# Patient Record
Sex: Female | Born: 1975 | Race: White | Hispanic: No | Marital: Married | Smoking: Current every day smoker
Health system: Southern US, Community
[De-identification: ages and names within clinical notes are randomized; demographics above are authoritative.]

## PROBLEM LIST (undated history)

## (undated) DIAGNOSIS — E039 Hypothyroidism, unspecified: Secondary | ICD-10-CM

## (undated) DIAGNOSIS — I219 Acute myocardial infarction, unspecified: Secondary | ICD-10-CM

## (undated) DIAGNOSIS — Z8781 Personal history of (healed) traumatic fracture: Secondary | ICD-10-CM

## (undated) DIAGNOSIS — F419 Anxiety disorder, unspecified: Secondary | ICD-10-CM

## (undated) DIAGNOSIS — Z8742 Personal history of other diseases of the female genital tract: Secondary | ICD-10-CM

## (undated) DIAGNOSIS — M6530 Trigger finger, unspecified finger: Secondary | ICD-10-CM

## (undated) DIAGNOSIS — G709 Myoneural disorder, unspecified: Secondary | ICD-10-CM

## (undated) DIAGNOSIS — M1990 Unspecified osteoarthritis, unspecified site: Secondary | ICD-10-CM

## (undated) DIAGNOSIS — Z972 Presence of dental prosthetic device (complete) (partial): Secondary | ICD-10-CM

## (undated) DIAGNOSIS — K5792 Diverticulitis of intestine, part unspecified, without perforation or abscess without bleeding: Secondary | ICD-10-CM

## (undated) DIAGNOSIS — E785 Hyperlipidemia, unspecified: Secondary | ICD-10-CM

## (undated) DIAGNOSIS — E079 Disorder of thyroid, unspecified: Secondary | ICD-10-CM

## (undated) DIAGNOSIS — Z9889 Other specified postprocedural states: Secondary | ICD-10-CM

## (undated) HISTORY — PX: ABDOMINAL HYSTERECTOMY: SHX81

## (undated) HISTORY — PX: APPENDECTOMY: SHX54

## (undated) HISTORY — DX: Hyperlipidemia, unspecified: E78.5

## (undated) HISTORY — PX: LAPAROSCOPY: SHX197

## (undated) HISTORY — DX: Disorder of thyroid, unspecified: E07.9

## (undated) HISTORY — PX: TOTAL THYROIDECTOMY: SHX2547

## (undated) HISTORY — PX: MOUTH SURGERY: SHX715

## (undated) HISTORY — DX: Myoneural disorder, unspecified: G70.9

## (undated) HISTORY — DX: Other specified postprocedural states: Z98.890

## (undated) HISTORY — PX: ABDOMINAL ADHESION SURGERY: SHX90

## (undated) HISTORY — PX: TRIGGER FINGER RELEASE: SHX641

## (undated) HISTORY — PX: CHOLECYSTECTOMY: SHX55

## (undated) HISTORY — DX: Personal history of other diseases of the female genital tract: Z87.42

## (undated) HISTORY — PX: THYROIDECTOMY: SHX17

## (undated) HISTORY — PX: OTHER SURGICAL HISTORY: SHX169

---
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
## 1999-10-13 HISTORY — PX: ABDOMINAL HYSTERECTOMY: SHX81

## 2011-01-29 ENCOUNTER — Other Ambulatory Visit: Payer: Self-pay | Admitting: Orthopedic Surgery

## 2011-01-29 DIAGNOSIS — M25532 Pain in left wrist: Secondary | ICD-10-CM

## 2011-02-23 ENCOUNTER — Ambulatory Visit
Admission: RE | Admit: 2011-02-23 | Discharge: 2011-02-23 | Disposition: A | Discharge status: Home / Self Care | Payer: BC Managed Care – PPO | Source: Ambulatory Visit | Attending: Orthopedic Surgery | Admitting: Orthopedic Surgery

## 2011-02-23 DIAGNOSIS — M25532 Pain in left wrist: Secondary | ICD-10-CM

## 2011-03-25 ENCOUNTER — Ambulatory Visit (HOSPITAL_BASED_OUTPATIENT_CLINIC_OR_DEPARTMENT_OTHER)
Admission: RE | Admit: 2011-03-25 | Discharge: 2011-03-25 | Disposition: A | Discharge status: Home / Self Care | Payer: BC Managed Care – PPO | Source: Ambulatory Visit | Attending: Orthopedic Surgery | Admitting: Orthopedic Surgery

## 2011-03-25 DIAGNOSIS — M24139 Other articular cartilage disorders, unspecified wrist: Secondary | ICD-10-CM | POA: Insufficient documentation

## 2011-03-25 DIAGNOSIS — F17200 Nicotine dependence, unspecified, uncomplicated: Secondary | ICD-10-CM | POA: Insufficient documentation

## 2011-03-25 DIAGNOSIS — Z01812 Encounter for preprocedural laboratory examination: Secondary | ICD-10-CM | POA: Insufficient documentation

## 2011-03-25 DIAGNOSIS — X58XXXS Exposure to other specified factors, sequela: Secondary | ICD-10-CM | POA: Insufficient documentation

## 2011-03-25 DIAGNOSIS — F411 Generalized anxiety disorder: Secondary | ICD-10-CM | POA: Insufficient documentation

## 2011-03-25 DIAGNOSIS — K589 Irritable bowel syndrome without diarrhea: Secondary | ICD-10-CM | POA: Insufficient documentation

## 2011-03-25 HISTORY — PX: WRIST ARTHROSCOPY W/ TRIANGULAR FIBROCARTILAGE REPAIR: SHX2670

## 2011-03-25 LAB — POCT HEMOGLOBIN-HEMACUE: Hemoglobin: 15.8 g/dL — ABNORMAL HIGH (ref 12.0–15.0)

## 2011-05-01 NOTE — Op Note (Signed)
[name] [name], [name] [name]             ACCOUNT NO.:  [id number]  MEDICAL RECORD NO.:  [id number]  LOCATION:                                 FACILITY:  PHYSICIAN:  Cindee Salt, M.D.            DATE OF BIRTH:  DATE OF PROCEDURE:  03/25/2011 DATE OF DISCHARGE:                              OPERATIVE REPORT   PREOPERATIVE DIAGNOSIS:  Triangular fibrocartilage complex tear, LT tear, left wrist.  POSTOPERATIVE DIAGNOSIS:  Triangular fibrocartilage complex tear, LT tear, left wrist.  OPERATION:  Arthroscopic inspection, debridement LT tear repair, peripheral 1B TFCC tear, left wrist.  SURGEON:  Cindee Salt, MD  ANESTHESIA:  Axillary general.  ANESTHESIOLOGIST:  Janetta Hora. Frederick, MD  HISTORY:  The patient is a 35 year old female with a history of injury to her left wrist.  She has had ulnar-sided wrist pain, not responded to conservative treatment.  She has had an MRI done revealing extravasation of contrast within the ECU tendon sheath.  Pre, peri, and postoperative course have been discussed along with the risks and complications including the possibility of incomplete relief of symptoms, dystrophy, infection, recurrence, injury to arteries, nerves, tendons.  In the preoperative area, the patient was seen.  The extremity marked by both patient and surgeon.  Antibiotic given.  PROCEDURE IN DETAIL:  The patient was brought to the operating room where a general anesthetic was carried out without difficulty after a axillary block was carried out.  She was prepped using ChloraPrep, supine position, left arm free, 3-minute dry time was allowed.  Time-out taken, confirmed patient procedure.  The limb was placed in the arc arthroscopy tower, 10 pounds traction applied.  The joint inflated through 3/4 portal.  An attempt was made after making transverse incision deepening this with a hemostat with the entrance of a 7 mm scope, this was too large, did not enter the joint as such a  1.9-mm scope was then used.  The joint was inspected.  Scapholunate ligament was intact.  Volar radial wrist ligaments were intact.  There were no significant change to the articular surface.  The TFCC was noted to have a moderate tenosynovitis on its ulnar aspect.  A lunotriquetral tear was noted.  This allowed positioning of a 18 gauge needle for irrigation.  A 4/5 portal was then opened after localization with a 22 gauge needle.  A transverse incision made deepened with a hemostat.  Blunt trocar used to enter the joint.  The probe was then introduced.  The peripheral tear of the triangle fibrocartilage was noted.  A 2-mm full radius shaver was then introduced into the 4/5 portal, debridement of the synovial tissue over the tear was then performed.  An attempt was made to do a repair with a Tuohy needle, this was unsuccessful and that the joint was extremely tight.  It was decided to proceed with a Whipple type repair. A longitudinal incision was made over the dorsal aspect of the ECU tendon.  A 22-gauge needle inserted through the tear of the TFCC.  A second 22-gauge needle was introduced distal to the triangle fibrocartilage.  This was difficult to pass a 3-0 PDS suture as such  these were replaced with an 18-gauge needle on the distal needle and a 21-gauge needle on the proximal needle going through the triangle fibrocartilage.  This was able to be used to insert a PDS through the 21- gauge, a wire loop through the 18-gauge, and a completion of the tear repair was then performed after debridement of the synovial tissue. This firmly reattached the TFCC peripherally.  This was done through an incision longitudinal in nature down to the capsule to protect the dorsal sensory branch of the ulnar nerve and the ECU tendon.  The wound was then irrigated.  The debridement of the LT joint was then performed with a shaver.  The scope instruments were removed.  The portals closed along with the  incision with interrupted 4-0 Vicryl Rapide sutures.  A sterile compressive dressing long-arm splint with the wrist in neutral position was applied.  The tourniquet was not used.  On completion, the patient was taken to recovery room for observation in satisfactory condition.  She will be discharged home to return to the [facility] in 1 week on Vicodin.          ______________________________ Cindee Salt, M.D.     GK/MEDQ  D:  03/25/2011  T:  03/26/2011  Job:  [id number]  Electronically Signed by Cindee Salt M.D. on 05/01/2011 09:17:10 AM

## 2012-04-28 DIAGNOSIS — E890 Postprocedural hypothyroidism: Secondary | ICD-10-CM | POA: Insufficient documentation

## 2012-07-13 ENCOUNTER — Other Ambulatory Visit: Payer: Self-pay | Admitting: Orthopedic Surgery

## 2012-07-19 ENCOUNTER — Encounter (HOSPITAL_BASED_OUTPATIENT_CLINIC_OR_DEPARTMENT_OTHER): Payer: Self-pay | Admitting: *Deleted

## 2012-07-29 DIAGNOSIS — K5909 Other constipation: Secondary | ICD-10-CM | POA: Insufficient documentation

## 2012-08-24 ENCOUNTER — Ambulatory Visit (HOSPITAL_BASED_OUTPATIENT_CLINIC_OR_DEPARTMENT_OTHER): Admit: 2012-08-24 | Payer: BC Managed Care – PPO | Admitting: Orthopedic Surgery

## 2012-08-24 ENCOUNTER — Encounter (HOSPITAL_BASED_OUTPATIENT_CLINIC_OR_DEPARTMENT_OTHER): Payer: Self-pay

## 2012-08-24 SURGERY — RELEASE, A1 PULLEY, FOR TRIGGER FINGER
Anesthesia: Choice | Laterality: Right

## 2012-10-12 DIAGNOSIS — M6530 Trigger finger, unspecified finger: Secondary | ICD-10-CM

## 2012-10-12 HISTORY — DX: Trigger finger, unspecified finger: M65.30

## 2012-11-02 ENCOUNTER — Other Ambulatory Visit: Payer: Self-pay | Admitting: Orthopedic Surgery

## 2012-11-11 ENCOUNTER — Encounter (HOSPITAL_BASED_OUTPATIENT_CLINIC_OR_DEPARTMENT_OTHER): Payer: Self-pay | Admitting: *Deleted

## 2012-11-18 ENCOUNTER — Encounter (HOSPITAL_BASED_OUTPATIENT_CLINIC_OR_DEPARTMENT_OTHER): Payer: Self-pay

## 2012-11-18 ENCOUNTER — Ambulatory Visit (HOSPITAL_BASED_OUTPATIENT_CLINIC_OR_DEPARTMENT_OTHER): Payer: BC Managed Care – PPO | Admitting: Anesthesiology

## 2012-11-18 ENCOUNTER — Encounter (HOSPITAL_BASED_OUTPATIENT_CLINIC_OR_DEPARTMENT_OTHER): Payer: Self-pay | Admitting: Anesthesiology

## 2012-11-18 ENCOUNTER — Ambulatory Visit (HOSPITAL_BASED_OUTPATIENT_CLINIC_OR_DEPARTMENT_OTHER)
Admission: RE | Admit: 2012-11-18 | Discharge: 2012-11-18 | Disposition: A | Discharge status: Home / Self Care | Payer: BC Managed Care – PPO | Source: Ambulatory Visit | Attending: Orthopedic Surgery | Admitting: Orthopedic Surgery

## 2012-11-18 ENCOUNTER — Encounter (HOSPITAL_BASED_OUTPATIENT_CLINIC_OR_DEPARTMENT_OTHER)
Admission: RE | Disposition: A | Discharge status: Home / Self Care | Payer: Self-pay | Source: Ambulatory Visit | Attending: Orthopedic Surgery

## 2012-11-18 DIAGNOSIS — M6530 Trigger finger, unspecified finger: Secondary | ICD-10-CM | POA: Insufficient documentation

## 2012-11-18 DIAGNOSIS — M65839 Other synovitis and tenosynovitis, unspecified forearm: Secondary | ICD-10-CM | POA: Insufficient documentation

## 2012-11-18 HISTORY — DX: Hypothyroidism, unspecified: E03.9

## 2012-11-18 HISTORY — DX: Diverticulitis of intestine, part unspecified, without perforation or abscess without bleeding: K57.92

## 2012-11-18 HISTORY — DX: Presence of dental prosthetic device (complete) (partial): Z97.2

## 2012-11-18 HISTORY — DX: Unspecified osteoarthritis, unspecified site: M19.90

## 2012-11-18 HISTORY — PX: TRIGGER FINGER RELEASE: SHX641

## 2012-11-18 HISTORY — DX: Personal history of (healed) traumatic fracture: Z87.81

## 2012-11-18 HISTORY — DX: Anxiety disorder, unspecified: F41.9

## 2012-11-18 HISTORY — DX: Trigger finger, unspecified finger: M65.30

## 2012-11-18 SURGERY — RELEASE, A1 PULLEY, FOR TRIGGER FINGER
Anesthesia: Regional | Site: Finger | Laterality: Right | Wound class: Clean

## 2012-11-18 MED ORDER — CEFAZOLIN SODIUM-DEXTROSE 2-3 GM-% IV SOLR
2.0000 g | INTRAVENOUS | Status: AC
  Administered 2012-11-18: 2 g via INTRAVENOUS

## 2012-11-18 MED ORDER — LACTATED RINGERS IV SOLN
INTRAVENOUS | Status: DC
  Administered 2012-11-18: 12:00:00 via INTRAVENOUS

## 2012-11-18 MED ORDER — HYDROMORPHONE HCL PF 1 MG/ML IJ SOLN: 0.2500 mg | INTRAMUSCULAR | Status: DC | PRN

## 2012-11-18 MED ORDER — OXYCODONE HCL 5 MG/5ML PO SOLN: 5.0000 mg | Freq: Once | ORAL | Status: DC | PRN

## 2012-11-18 MED ORDER — ONDANSETRON HCL 4 MG/2ML IJ SOLN
INTRAMUSCULAR | Status: DC | PRN
  Administered 2012-11-18: 4 mg via INTRAVENOUS

## 2012-11-18 MED ORDER — BUPIVACAINE HCL (PF) 0.25 % IJ SOLN
INTRAMUSCULAR | Status: DC | PRN
  Administered 2012-11-18: 5 mL

## 2012-11-18 MED ORDER — CHLORHEXIDINE GLUCONATE 4 % EX LIQD: 60.0000 mL | Freq: Once | CUTANEOUS | Status: DC

## 2012-11-18 MED ORDER — OXYCODONE HCL 5 MG PO TABS: 5.0000 mg | ORAL_TABLET | Freq: Once | ORAL | Status: DC | PRN

## 2012-11-18 MED ORDER — MIDAZOLAM HCL 5 MG/5ML IJ SOLN
INTRAMUSCULAR | Status: DC | PRN
  Administered 2012-11-18: 2 mg via INTRAVENOUS

## 2012-11-18 MED ORDER — FENTANYL CITRATE 0.05 MG/ML IJ SOLN
INTRAMUSCULAR | Status: DC | PRN
  Administered 2012-11-18: 50 ug via INTRAVENOUS

## 2012-11-18 MED ORDER — MIDAZOLAM HCL 2 MG/2ML IJ SOLN: 1.0000 mg | INTRAMUSCULAR | Status: DC | PRN

## 2012-11-18 MED ORDER — HYDROCODONE-ACETAMINOPHEN 5-325 MG PO TABS: 1.0000 | ORAL_TABLET | Freq: Four times a day (QID) | ORAL | Status: DC | PRN

## 2012-11-18 MED ORDER — MIDAZOLAM HCL 2 MG/ML PO SYRP: 12.0000 mg | ORAL_SOLUTION | Freq: Once | ORAL | Status: DC | PRN

## 2012-11-18 MED ORDER — PROPOFOL 10 MG/ML IV EMUL
INTRAVENOUS | Status: DC | PRN
  Administered 2012-11-18: 75 ug/kg/min via INTRAVENOUS

## 2012-11-18 MED ORDER — FENTANYL CITRATE 0.05 MG/ML IJ SOLN: 50.0000 ug | INTRAMUSCULAR | Status: DC | PRN

## 2012-11-18 MED ORDER — PROMETHAZINE HCL 25 MG/ML IJ SOLN: 6.2500 mg | INTRAMUSCULAR | Status: DC | PRN

## 2012-11-18 MED ORDER — MEPERIDINE HCL 25 MG/ML IJ SOLN: 6.2500 mg | INTRAMUSCULAR | Status: DC | PRN

## 2012-11-18 MED ORDER — LIDOCAINE HCL (CARDIAC) 20 MG/ML IV SOLN
INTRAVENOUS | Status: DC | PRN
  Administered 2012-11-18: 30 mg via INTRAVENOUS

## 2012-11-18 SURGICAL SUPPLY — 34 items
BANDAGE COBAN STERILE 2 (GAUZE/BANDAGES/DRESSINGS) ×2 IMPLANT
BLADE SURG 15 STRL LF DISP TIS (BLADE) ×1 IMPLANT
BLADE SURG 15 STRL SS (BLADE) ×1
BNDG ESMARK 4X9 LF (GAUZE/BANDAGES/DRESSINGS) IMPLANT
CHLORAPREP W/TINT 26ML (MISCELLANEOUS) ×2 IMPLANT
CLOTH BEACON ORANGE TIMEOUT ST (SAFETY) ×2 IMPLANT
CORDS BIPOLAR (ELECTRODE) IMPLANT
COVER MAYO STAND STRL (DRAPES) ×2 IMPLANT
COVER TABLE BACK 60X90 (DRAPES) ×2 IMPLANT
CUFF TOURNIQUET SINGLE 18IN (TOURNIQUET CUFF) ×2 IMPLANT
DECANTER SPIKE VIAL GLASS SM (MISCELLANEOUS) IMPLANT
DRAPE EXTREMITY T 121X128X90 (DRAPE) ×2 IMPLANT
DRAPE SURG 17X23 STRL (DRAPES) ×2 IMPLANT
GAUZE XEROFORM 1X8 LF (GAUZE/BANDAGES/DRESSINGS) ×2 IMPLANT
GLOVE BIO SURGEON STRL SZ 6.5 (GLOVE) IMPLANT
GLOVE BIOGEL PI IND STRL 8.5 (GLOVE) ×1 IMPLANT
GLOVE BIOGEL PI INDICATOR 8.5 (GLOVE) ×1
GLOVE ECLIPSE 6.5 STRL STRAW (GLOVE) ×2 IMPLANT
GLOVE SURG ORTHO 8.0 STRL STRW (GLOVE) ×2 IMPLANT
GOWN BRE IMP PREV XXLGXLNG (GOWN DISPOSABLE) ×2 IMPLANT
GOWN PREVENTION PLUS XLARGE (GOWN DISPOSABLE) ×2 IMPLANT
NEEDLE 27GAX1X1/2 (NEEDLE) ×2 IMPLANT
NS IRRIG 1000ML POUR BTL (IV SOLUTION) ×2 IMPLANT
PACK BASIN DAY SURGERY FS (CUSTOM PROCEDURE TRAY) ×2 IMPLANT
PADDING CAST ABS 4INX4YD NS (CAST SUPPLIES)
PADDING CAST ABS COTTON 4X4 ST (CAST SUPPLIES) IMPLANT
SPONGE GAUZE 4X4 12PLY (GAUZE/BANDAGES/DRESSINGS) ×2 IMPLANT
STOCKINETTE 4X48 STRL (DRAPES) ×2 IMPLANT
SUT VICRYL RAPIDE 4/0 PS 2 (SUTURE) ×2 IMPLANT
SYR BULB 3OZ (MISCELLANEOUS) ×2 IMPLANT
SYR CONTROL 10ML LL (SYRINGE) ×2 IMPLANT
TOWEL OR 17X24 6PK STRL BLUE (TOWEL DISPOSABLE) ×4 IMPLANT
UNDERPAD 30X30 INCONTINENT (UNDERPADS AND DIAPERS) ×2 IMPLANT
WATER STERILE IRR 1000ML POUR (IV SOLUTION) IMPLANT

## 2012-11-18 NOTE — Brief Op Note (Signed)
11/18/2012  1:38 PM  PATIENT:  [name] [name]  37 y.o. female  PRE-OPERATIVE DIAGNOSIS:  sts right middle finger   POST-OPERATIVE DIAGNOSIS:  sts right middle finger   PROCEDURE:  Procedure(s) (LRB) with comments: RELEASE TRIGGER FINGER/A-1 PULLEY (Right)  SURGEON:  Surgeon(s) and Role:    * Nicki Reaper, MD - Primary  PHYSICIAN ASSISTANT:   ASSISTANTS: none   ANESTHESIA:   local and regional  EBL:  Total I/O In: 700 [I.V.:700] Out: -   BLOOD ADMINISTERED:none  DRAINS: none   LOCAL MEDICATIONS USED:  MARCAINE     SPECIMEN:  No Specimen  DISPOSITION OF SPECIMEN:  N/A  COUNTS:  YES  TOURNIQUET:   Total Tourniquet Time Documented: Forearm (Right) - 14 minutes  DICTATION: .Other Dictation: Dictation Number [id number]  PLAN OF CARE: Discharge to home after PACU  PATIENT DISPOSITION:  PACU - hemodynamically stable.

## 2012-11-18 NOTE — Anesthesia Procedure Notes (Signed)
Procedure Name: MAC Date/Time: 11/18/2012 1:17 PM Performed by: Brieanne Mignone D Pre-anesthesia Checklist: Patient identified, Emergency Drugs available, Suction available, Patient being monitored and Timeout performed Patient Re-evaluated:Patient Re-evaluated prior to inductionOxygen Delivery Method: Simple face mask

## 2012-11-18 NOTE — Transfer of Care (Signed)
Immediate Anesthesia Transfer of Care Note  Patient: [name] [name]  Procedure(s) Performed: Procedure(s) (LRB) with comments: RELEASE TRIGGER FINGER/A-1 PULLEY (Right)  Patient Location: PACU  Anesthesia Type:Bier block  Level of Consciousness: awake, alert , oriented and patient cooperative  Airway & Oxygen Therapy: Patient Spontanous Breathing and Patient connected to face mask oxygen  Post-op Assessment: Report given to PACU RN and Post -op Vital signs reviewed and stable  Post vital signs: Reviewed and stable  Complications: No apparent anesthesia complications

## 2012-11-18 NOTE — Op Note (Signed)
[name] [name], [name] [name]             ACCOUNT NO.:  [id number]  MEDICAL RECORD NO.:  [id number]  LOCATION:                                 FACILITY:  PHYSICIAN:  Cindee Salt, M.D.            DATE OF BIRTH:  DATE OF PROCEDURE:  11/18/2012 DATE OF DISCHARGE:                              OPERATIVE REPORT   PREOPERATIVE DIAGNOSIS:  Stenosing tenosynovitis, right middle finger.  POSTOPERATIVE DIAGNOSIS:  Stenosing tenosynovitis, right middle finger.  OPERATION:  Release A1 pulley, right middle finger.  SURGEON:  Cindee Salt, M.D.  ANESTHESIA:  Forearm-based IV regional with local infiltration.  ANESTHESIOLOGIST:  Sheldon Silvan, M.D.  HISTORY:  The patient is a 37 year old female with a history of triggering of her right middle finger.  This has not responded to conservative treatment.  She has elected to undergo surgical release. Pre, peri, and postoperative course have been discussed along with risks and complications.  She is aware that there is no guarantee with the surgery; possibility of infection; recurrence of injury to arteries, nerves, tendons, incomplete relief of symptoms, dystrophy.  In the preoperative area, the patient is seen, the extremity marked by both patient and surgeon.  Antibiotic given.  PROCEDURE:  The patient was brought to the operating room where a forearm-based IV regional anesthetic was carried out without difficulty. She was prepped using ChloraPrep, supine position with the right arm free.  A 3-minute dry time was allowed.  Time-out taken, confirming patient and procedure.  An oblique incision was made over the A1 pulley of the right middle finger, carried down through subcutaneous tissue. Bleeders were electrocauterized with bipolar.  The retractors were placed after identification of the A1 pulley.  This was released on its radial aspect.  A small incision was made centrally in A2.  A partial tenosynovectomy performed proximally.  Finger placed through  a full range motion, no further triggering was noted.  The wound was irrigated. The skin was closed with interrupted 4-0 Vicryl Rapide sutures.  Local infiltration with 0.25% Marcaine without epinephrine was given, 5 mL was used.  Sterile compressive dressing with fingers free was applied.  On deflation of the tourniquet, all fingers immediately pinked.  She was taken to the recovery room for observation in a satisfactory condition.  She will be discharged home to return to the [facility] in 1 week on Vicodin.          ______________________________ Cindee Salt, M.D.     GK/MEDQ  D:  11/18/2012  T:  11/18/2012  Job:  [id number]

## 2012-11-18 NOTE — Anesthesia Preprocedure Evaluation (Signed)
Anesthesia Evaluation  Patient identified by MRN, date of birth, ID band Patient awake    History of Anesthesia Complications Negative for: history of anesthetic complications  Airway Mallampati: I  Neck ROM: Full    Dental  (+) Teeth Intact   Pulmonary neg pulmonary ROS,  breath sounds clear to auscultation        Cardiovascular negative cardio ROS  Rhythm:Regular Rate:Normal     Neuro/Psych  Neuromuscular disease negative neurological ROS     GI/Hepatic negative GI ROS, Neg liver ROS,   Endo/Other  Hypothyroidism   Renal/GU negative Renal ROS     Musculoskeletal  (+) Arthritis -,   Abdominal   Peds  Hematology   Anesthesia Other Findings   Reproductive/Obstetrics                           Anesthesia Physical Anesthesia Plan  ASA: I  Anesthesia Plan: Bier Block   Post-op Pain Management:    Induction:   Airway Management Planned: Natural Airway and Simple Face Mask  Additional Equipment:   Intra-op Plan:   Post-operative Plan:   Informed Consent:   Plan Discussed with: CRNA and Surgeon  Anesthesia Plan Comments:         Anesthesia Quick Evaluation

## 2012-11-18 NOTE — Op Note (Signed)
Dictation Number [id number]

## 2012-11-18 NOTE — Anesthesia Postprocedure Evaluation (Signed)
  Anesthesia Post-op Note  Patient: [name] [name]  Procedure(s) Performed: Procedure(s) (LRB) with comments: RELEASE TRIGGER FINGER/A-1 PULLEY (Right)  Patient Location: PACU  Anesthesia Type:Bier block  Level of Consciousness: awake  Airway and Oxygen Therapy: Patient Spontanous Breathing  Post-op Pain: mild  Post-op Assessment: Post-op Vital signs reviewed  Post-op Vital Signs: stable  Complications: No apparent anesthesia complications

## 2012-11-18 NOTE — H&P (Signed)
[name] [name] is a 37 year old right hand dominant female. She presents with respect to pain in her left wrist. This has been going on for 3 weeks. She complains of decreased strength, a feeling of numbness and tingling and weakness. She states this has been going on for years however. She was diagnosed with Gorlin's syndrome at [facility] She states that she may have had a fracture in her wrist when she was young but does not know where. She is complaining of increasing constant, moderate stabbing and sharp pain. She localizes this to the central aspect of her wrist with a feeling of weakness. She was given a Prednisone Dosepak which gave her relief of symptoms but this has not been continued with use of Advil and Tylenol. Rest makes it better along with wrapping. She has no discrete history of injury. She is not affected on her right side. She has a history of thyroid problems. She has no history of diabetes, arthritis or gout. There is a family history of diabetes and arthritis. She has undergone arthroscopy and treatment of TFCC and LT tear of the left wrist She is having  triggering of her right middle finger. She desires having this surgically released and declined injections.  Past Medical History: She has no allergies. She is on Colestid, Hyoscyamine, and Wellbutrin. She has had cysts removed, partial hysterectomy, appendectomy, cholecystectomy and scar tissue.  Family Medical History: Positive for diabetes, heart disease, high BP and arthritis.  Social History: She smokes 1 pack a day and is advised to quit and the reasons behind this. She does not drink. 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  Review of Systems: Positive for weight loss, loss of appetite, pneumonia, depression, nervousness, and headaches, otherwise negative. [name] [name] is an 37 y.o. female.   Chief Complaint: STS Rt middle HPI: see above  Past Medical History  Diagnosis Date  . History of  skull fracture age 40    no residual effects  . Hypothyroidism   . Diverticulitis   . Arthritis     ankle, left wrist, back  . Anxiety   . Stenosing tenosynovitis of finger 10/2012    right middle finger  . Wears partial dentures     upper    Past Surgical History  Procedure Date  . Wrist arthroscopy w/ triangular fibrocartilage repair 03/25/2011    and debridement LT tear  . Mouth surgery     removal of cysts - x 4 surgeries  . Cholecystectomy   . Appendectomy   . Abdominal hysterectomy 2001    complete  . Total thyroidectomy   . Partial hysterectomy 1989  . Laparoscopy   . Abdominal adhesion surgery     History reviewed. No pertinent family history. Social History:  reports that she has been smoking Cigarettes.  She has a 17 pack-year smoking history. She has never used smokeless tobacco. She reports that she does not drink alcohol or use illicit drugs.  Allergies: No Known Allergies  Medications Prior to Admission  Medication Sig Dispense Refill  . ALPRAZolam (XANAX) 0.5 MG tablet Take 0.5 mg by mouth at bedtime as needed.      Marland Kitchen escitalopram (LEXAPRO) 10 MG tablet Take 10 mg by mouth daily.      Marland Kitchen levothyroxine (SYNTHROID, LEVOTHROID) 112 MCG tablet Take 112 mcg by mouth daily.        No results found for this or any previous visit (from the past 48 hour(s)).  No  results found.   Pertinent items are noted in HPI.  Blood pressure 134/82, pulse 93, temperature 97.9 F (36.6 C), temperature source Oral, resp. rate 18, height 5\' 1"  (1.549 m), weight 73.086 kg (161 lb 2 oz), SpO2 100.00%.  General appearance: alert, cooperative and appears stated age Head: Normocephalic, without obvious abnormality Neck: no adenopathy and no JVD Resp: clear to auscultation bilaterally Cardio: regular rate and rhythm, S1, S2 normal, no murmur, click, rub or gallop GI: soft, non-tender; bowel sounds normal; no masses,  no organomegaly Extremities: extremities normal, atraumatic, no  cyanosis or edema Pulses: 2+ and symmetric Skin: Skin color, texture, turgor normal. No rashes or lesions Neurologic: Grossly normal Incision/Wound: na  Assessment/Plan The pre, peri and post op course are discussed along with risks and complications.  She is aware there is no guarantee with surgery, possibility of infection, recurrence, injury to arteries, nerves and tendons, incomplete relief of symptoms and dystrophy.  She is scheduled for release A-1 pulley right middle finger as an outpatient.  Llewellyn Choplin R 11/18/2012, 12:05 PM

## 2012-11-21 ENCOUNTER — Encounter (HOSPITAL_BASED_OUTPATIENT_CLINIC_OR_DEPARTMENT_OTHER): Payer: Self-pay | Admitting: Orthopedic Surgery

## 2013-02-02 DIAGNOSIS — M25572 Pain in left ankle and joints of left foot: Secondary | ICD-10-CM | POA: Insufficient documentation

## 2013-05-18 DIAGNOSIS — G5601 Carpal tunnel syndrome, right upper limb: Secondary | ICD-10-CM | POA: Insufficient documentation

## 2013-05-18 DIAGNOSIS — M7120 Synovial cyst of popliteal space [Baker], unspecified knee: Secondary | ICD-10-CM | POA: Insufficient documentation

## 2014-03-19 DIAGNOSIS — M5416 Radiculopathy, lumbar region: Secondary | ICD-10-CM | POA: Insufficient documentation

## 2014-03-27 ENCOUNTER — Encounter: Payer: Self-pay | Admitting: Physical Medicine & Rehabilitation

## 2014-03-29 ENCOUNTER — Ambulatory Visit (HOSPITAL_BASED_OUTPATIENT_CLINIC_OR_DEPARTMENT_OTHER): Payer: BC Managed Care – PPO | Admitting: Physical Medicine & Rehabilitation

## 2014-03-29 ENCOUNTER — Encounter: Payer: BC Managed Care – PPO | Attending: Physical Medicine & Rehabilitation

## 2014-03-29 ENCOUNTER — Encounter: Payer: Self-pay | Admitting: Physical Medicine & Rehabilitation

## 2014-03-29 VITALS — BP 149/96 | HR 131 | Resp 14 | Ht 61.0 in | Wt 165.4 lb

## 2014-03-29 DIAGNOSIS — IMO0002 Reserved for concepts with insufficient information to code with codable children: Secondary | ICD-10-CM

## 2014-03-29 DIAGNOSIS — G90529 Complex regional pain syndrome I of unspecified lower limb: Secondary | ICD-10-CM | POA: Insufficient documentation

## 2014-03-29 DIAGNOSIS — Z5181 Encounter for therapeutic drug level monitoring: Secondary | ICD-10-CM

## 2014-03-29 DIAGNOSIS — Z79899 Other long term (current) drug therapy: Secondary | ICD-10-CM

## 2014-03-29 DIAGNOSIS — M545 Low back pain, unspecified: Secondary | ICD-10-CM | POA: Insufficient documentation

## 2014-03-29 DIAGNOSIS — G90519 Complex regional pain syndrome I of unspecified upper limb: Secondary | ICD-10-CM | POA: Insufficient documentation

## 2014-03-29 DIAGNOSIS — G9050 Complex regional pain syndrome I, unspecified: Secondary | ICD-10-CM

## 2014-03-29 DIAGNOSIS — M79609 Pain in unspecified limb: Secondary | ICD-10-CM | POA: Insufficient documentation

## 2014-03-29 NOTE — Patient Instructions (Addendum)
I believe you do meet  the ISAP criteria for complex regional pain syndrome  I would recommend physical therapy for desensitization of the left leg.  I would recommend completing your treatment for your lumbar pain and what may be a radiculopathy. You may discuss getting in MRI of the lumbar spine with your neurologist  Other medications that may be helpful either in conjunction with low dose gabapentin or in place of would include nortriptyline, amitriptyline, or topiramate. Lyrica also may be helpful but tends to have more side effects than gabapentin  Another potential treatment includes lumbar sympathetic ganglion block which can be done at [facility].  This was a consultation only, I believe you can get out of treatment the need at [facility]

## 2014-03-29 NOTE — Progress Notes (Signed)
Subjective:    Patient ID: [name] [name], female    DOB: 07/31/1976, 38 y.o.   MRN: [id number]  HPI CC Left foot pain  Several year history of multiple left ankle sprains.  Underwent ligament repair 08/15/2013 Patient has been tried on gabapentin 300 mg in the morning, 300 mg at lunchtime, 600 mg at night Patient was on higher dose 2400 mg several weeks ago, states she had swelling and needed to take fluid pills and the dose was reduced, patient does not feel the pain was relieved well even at the higher dose.  Has history of back pain and bulging disc. [facility] Has had epidural x2. Third when scheduled July 9. Had some relief after the first injection felt worse after the second injection.  Had 2 injections in the foot. Cortisone shots first 1 exacerbated pain second one had some mild relief EMG diagnosed what sounds like lumbar radiculopathy   Norco prescription date 03/24/2049 #60 with 52 left, does not seem to help a lot  PMH:  Gorland's syndrome, uterine cysts PSH  Trigger finger release, Total thyroidectomy, cysts removed from ribs 1990, 1991, 1992, 2000 cyst removed from gums. 2000 cholecystectomy 2001 appendectomy after ruptured hemorrhagic cyst 2001 total hysterectomy 2010 scar tissue removal abdomen 2011 left wrist for ligaments and cartilage repair 2012 thyroidectomy 2014 left ankle ligament repair 2013 right trigger finger release Pain Inventory Average Pain 3 Pain Right Now 6 My pain is constant, sharp, stabbing and aching  In the last 24 hours, has pain interfered with the following? General activity 2 Relation with others 5 Enjoyment of life 3 What TIME of day is your pain at its worst? night Sleep (in general) Fair  Pain is worse with: walking, standing and some activites Pain improves with: rest and medication Relief from Meds: 3  Mobility walk without assistance how many minutes can you walk? 30 ability to climb steps?  yes do you  drive?  yes  Function employed # of hrs/week on Leave disabled: date disabled pending I need assistance with the following:  meal prep, household duties and shopping  Neuro/Psych bladder control problems numbness tingling anxiety  Prior Studies Any changes since last visit?  no  Physicians involved in your care Any changes since last visit?  no   No family history on file. History   Social History  . Marital Status: Married    Spouse Name: N/A    Number of Children: N/A  . Years of Education: N/A   Social History Main Topics  . Smoking status: Not on file  . Smokeless tobacco: Not on file  . Alcohol Use: Not on file  . Drug Use: Not on file  . Sexual Activity: Not on file   Other Topics Concern  . Not on file   Social History Narrative  . No narrative on file   No past surgical history on file. No past medical history on file. BP 149/96  Pulse 131  Resp 14  Ht 5\' 1"  (1.549 m)  Wt 165 lb 6.4 oz (75.025 kg)  BMI 31.27 kg/m2  SpO2 100%  Opioid Risk Score: 2 Fall Risk Score:  (educated and handout given for fall prevention in the home) Review of Systems  Constitutional: Positive for appetite change and unexpected weight change.  Gastrointestinal: Positive for nausea, abdominal pain, diarrhea and constipation.  Genitourinary:       Bladder control problems  Musculoskeletal: Positive for gait problem.  Neurological: Positive for numbness.  Tingling  Psychiatric/Behavioral: The patient is nervous/anxious.   All other systems reviewed and are negative.      Objective:   Physical Exam  Nursing note and vitals reviewed. Constitutional: She is oriented to person, place, and time. She appears well-developed and well-nourished.  HENT:  Head: Normocephalic and atraumatic.  Eyes: Conjunctivae are normal. Pupils are equal, round, and reactive to light.  Neck: Normal range of motion.  Neurological: She is alert and oriented to person, place, and time.    Psychiatric: She has a normal mood and affect.   decreased range of motion left ankle. Hyper sensitivity to touch over the left anterior ankle. Erythema bilateral feet states that she has sunburn Minimal swelling at the dorsum of the left foot. Pain with toe range of motion. 2 minus strength at the flexors and extensors as well as ankle dorsiflexor extensor related to pain Proximal strength is 4/5 in the knee extensor and 4 minus at the hip flexor 5/5 strength in the right lower extremity 5/5 strength in bilateral upper extremities        Assessment & Plan:  1. Patient meets diagnostic criteria for complex regional pain syndrome type I 1 in the left foot and ankle area. Discussed treatment options. She is not had adequate trial physical therapy for desensitizing the left foot and ankle.  2. Has lumbar pain with EMG findings of potential radiculopathy by patient report I do not have the exam. She is undergoing epidural steroid treatment and will beginning her third injection in the next 1-2 weeks. If this does not help resolve her symptoms she may benefit from lumbar sympathetic ganglion block. She states that her neurologist has discussed this with her already.  We also discussed medication management, generally speaking narcotic analgesics are really not indicated for this diagnosis. She is not had adequate relief with gabapentin and also does not tolerate side effects She may benefit from a trial of nortriptyline or potentially Lyrica or topiramate. She has discussed Lyrica with her neurologist.  We discussed that she is getting adequate treatment from her neurologist and we'll see her on a when necessary basis. I pointed out some of my recommendations so she can discussed these with her neurologist as well

## 2014-03-29 NOTE — Progress Notes (Signed)
International Association for the Study of Pain (IASP) clinical diagnostic criteria ([place] criteria)  continuing pain disproportionate to original injury  must have reports of at least 1 symptom in 3 of 4 categories  sensory - allodynia and/or hyperesthesia - + over scar vasomotor - temperature asymmetry and/or skin color changes and/or skin color asymmetry  negative sudomotor/edema - edema and/or sweating changes and/or sweating asymmetry positive motor/trophic - decreased range of motion and/or motor dysfunction (weakness, tremor, dystonia) and/or trophic changes (in hair, nails, or skin)must have at least 1 sign at time of evaluation in 2 or more categories  sensory - allodynia (to light touch and/or temperature and/or deep somatic pressure and/or joint movement) and/or hyperalgesia (to pinprick) positive allodynia to light touch dorsum of left foot vasomotor - evidence of temperature asymmetry (> 1 degree C [1.8 degrees F]) and/or skin color changes and/or skin color asymmetry, increased erythema left first dorsal web space ,  right first MTP 26.1 Celsius, left first MTP 26.1 Celsius sudomotor/edema -evidence of edema and/or sweating changes and/or sweating asymmetry -negative motor/trophic - evidence of decreased range of motion and/or motor dysfunction (weakness, tremor, dystonia) and/or trophic changes (in hair, nails, or skin)--5/5 strength in the right hip flexor knee extensor ankle dorsiflexor plantar flexor  no other diagnosis can better explain signs or symptoms- patient has lumbar bulging disc and potential radiculopathy. [facility]. Do not have these records. This may mimic RSD

## 2015-12-06 DIAGNOSIS — F419 Anxiety disorder, unspecified: Secondary | ICD-10-CM | POA: Insufficient documentation

## 2015-12-09 DIAGNOSIS — I10 Essential (primary) hypertension: Secondary | ICD-10-CM | POA: Insufficient documentation

## 2016-03-19 DIAGNOSIS — Q8789 Other specified congenital malformation syndromes, not elsewhere classified: Secondary | ICD-10-CM | POA: Insufficient documentation

## 2016-04-30 ENCOUNTER — Encounter (HOSPITAL_BASED_OUTPATIENT_CLINIC_OR_DEPARTMENT_OTHER): Payer: Self-pay | Admitting: Orthopedic Surgery

## 2017-02-17 DIAGNOSIS — M7711 Lateral epicondylitis, right elbow: Secondary | ICD-10-CM | POA: Insufficient documentation

## 2017-02-17 DIAGNOSIS — M7712 Lateral epicondylitis, left elbow: Secondary | ICD-10-CM

## 2017-02-17 DIAGNOSIS — R52 Pain, unspecified: Secondary | ICD-10-CM | POA: Insufficient documentation

## 2017-05-12 DIAGNOSIS — M65311 Trigger thumb, right thumb: Secondary | ICD-10-CM | POA: Insufficient documentation

## 2017-06-09 ENCOUNTER — Other Ambulatory Visit: Payer: Self-pay | Admitting: Orthopedic Surgery

## 2017-06-17 ENCOUNTER — Encounter (HOSPITAL_BASED_OUTPATIENT_CLINIC_OR_DEPARTMENT_OTHER): Payer: Self-pay | Admitting: *Deleted

## 2017-06-22 ENCOUNTER — Ambulatory Visit (HOSPITAL_BASED_OUTPATIENT_CLINIC_OR_DEPARTMENT_OTHER)
Admission: RE | Admit: 2017-06-22 | Discharge: 2017-06-22 | Disposition: A | Discharge status: Home / Self Care | Payer: 59 | Source: Ambulatory Visit | Attending: Orthopedic Surgery | Admitting: Orthopedic Surgery

## 2017-06-22 ENCOUNTER — Encounter (HOSPITAL_BASED_OUTPATIENT_CLINIC_OR_DEPARTMENT_OTHER)
Admission: RE | Disposition: A | Discharge status: Home / Self Care | Payer: Self-pay | Source: Ambulatory Visit | Attending: Orthopedic Surgery

## 2017-06-22 ENCOUNTER — Ambulatory Visit (HOSPITAL_BASED_OUTPATIENT_CLINIC_OR_DEPARTMENT_OTHER): Payer: 59 | Admitting: Anesthesiology

## 2017-06-22 ENCOUNTER — Encounter (HOSPITAL_BASED_OUTPATIENT_CLINIC_OR_DEPARTMENT_OTHER): Payer: Self-pay

## 2017-06-22 DIAGNOSIS — M7712 Lateral epicondylitis, left elbow: Secondary | ICD-10-CM | POA: Diagnosis not present

## 2017-06-22 DIAGNOSIS — F17210 Nicotine dependence, cigarettes, uncomplicated: Secondary | ICD-10-CM | POA: Insufficient documentation

## 2017-06-22 DIAGNOSIS — G5601 Carpal tunnel syndrome, right upper limb: Secondary | ICD-10-CM | POA: Diagnosis not present

## 2017-06-22 DIAGNOSIS — M659 Synovitis and tenosynovitis, unspecified: Secondary | ICD-10-CM | POA: Diagnosis not present

## 2017-06-22 DIAGNOSIS — I10 Essential (primary) hypertension: Secondary | ICD-10-CM | POA: Insufficient documentation

## 2017-06-22 DIAGNOSIS — E785 Hyperlipidemia, unspecified: Secondary | ICD-10-CM | POA: Insufficient documentation

## 2017-06-22 DIAGNOSIS — G709 Myoneural disorder, unspecified: Secondary | ICD-10-CM | POA: Insufficient documentation

## 2017-06-22 DIAGNOSIS — F419 Anxiety disorder, unspecified: Secondary | ICD-10-CM | POA: Insufficient documentation

## 2017-06-22 DIAGNOSIS — M65311 Trigger thumb, right thumb: Secondary | ICD-10-CM | POA: Diagnosis not present

## 2017-06-22 DIAGNOSIS — Z79891 Long term (current) use of opiate analgesic: Secondary | ICD-10-CM | POA: Diagnosis not present

## 2017-06-22 DIAGNOSIS — M7711 Lateral epicondylitis, right elbow: Secondary | ICD-10-CM | POA: Diagnosis not present

## 2017-06-22 DIAGNOSIS — Z79899 Other long term (current) drug therapy: Secondary | ICD-10-CM | POA: Diagnosis not present

## 2017-06-22 DIAGNOSIS — E039 Hypothyroidism, unspecified: Secondary | ICD-10-CM | POA: Diagnosis not present

## 2017-06-22 HISTORY — PX: TRIGGER FINGER RELEASE: SHX641

## 2017-06-22 HISTORY — PX: CARPAL TUNNEL RELEASE: SHX101

## 2017-06-22 LAB — POCT I-STAT, CHEM 8
BUN: 7 mg/dL (ref 6–20)
CALCIUM ION: 1.02 mmol/L — AB (ref 1.15–1.40)
Chloride: 98 mmol/L — ABNORMAL LOW (ref 101–111)
Creatinine, Ser: 0.9 mg/dL (ref 0.44–1.00)
Glucose, Bld: 125 mg/dL — ABNORMAL HIGH (ref 65–99)
HEMATOCRIT: 46 % (ref 36.0–46.0)
HEMOGLOBIN: 15.6 g/dL — AB (ref 12.0–15.0)
Potassium: 2.9 mmol/L — ABNORMAL LOW (ref 3.5–5.1)
SODIUM: 138 mmol/L (ref 135–145)
TCO2: 30 mmol/L (ref 22–32)

## 2017-06-22 SURGERY — CARPAL TUNNEL RELEASE
Anesthesia: Monitor Anesthesia Care | Site: Wrist | Laterality: Right

## 2017-06-22 MED ORDER — PROPOFOL 500 MG/50ML IV EMUL
INTRAVENOUS | Status: DC | PRN
  Administered 2017-06-22: 75 ug/kg/min via INTRAVENOUS

## 2017-06-22 MED ORDER — ROPIVACAINE HCL 5 MG/ML IJ SOLN
INTRAMUSCULAR | Status: DC | PRN
  Administered 2017-06-22: 30 mL via PERINEURAL

## 2017-06-22 MED ORDER — SCOPOLAMINE 1 MG/3DAYS TD PT72: 1.0000 | MEDICATED_PATCH | Freq: Once | TRANSDERMAL | Status: DC | PRN

## 2017-06-22 MED ORDER — FENTANYL CITRATE (PF) 100 MCG/2ML IJ SOLN
50.0000 ug | INTRAMUSCULAR | Status: DC | PRN
  Administered 2017-06-22: 100 ug via INTRAVENOUS
  Administered 2017-06-22: 50 ug via INTRAVENOUS

## 2017-06-22 MED ORDER — FENTANYL CITRATE (PF) 100 MCG/2ML IJ SOLN
INTRAMUSCULAR | Status: AC
  Filled 2017-06-22: qty 2

## 2017-06-22 MED ORDER — LACTATED RINGERS IV SOLN
INTRAVENOUS | Status: DC
  Administered 2017-06-22: 10:00:00 via INTRAVENOUS

## 2017-06-22 MED ORDER — CHLORHEXIDINE GLUCONATE 4 % EX LIQD: 60.0000 mL | Freq: Once | CUTANEOUS | Status: DC

## 2017-06-22 MED ORDER — MIDAZOLAM HCL 2 MG/2ML IJ SOLN
1.0000 mg | INTRAMUSCULAR | Status: DC | PRN
  Administered 2017-06-22 (×2): 1 mg via INTRAVENOUS
  Administered 2017-06-22: 2 mg via INTRAVENOUS

## 2017-06-22 MED ORDER — LIDOCAINE 2% (20 MG/ML) 5 ML SYRINGE
INTRAMUSCULAR | Status: DC | PRN
  Administered 2017-06-22: 50 mg via INTRAVENOUS

## 2017-06-22 MED ORDER — CEFAZOLIN SODIUM-DEXTROSE 2-4 GM/100ML-% IV SOLN
INTRAVENOUS | Status: AC
  Filled 2017-06-22: qty 100

## 2017-06-22 MED ORDER — OXYCODONE-ACETAMINOPHEN 5-325 MG PO TABS: 1.0000 | ORAL_TABLET | ORAL | 0 refills | Status: DC | PRN

## 2017-06-22 MED ORDER — MIDAZOLAM HCL 2 MG/2ML IJ SOLN
INTRAMUSCULAR | Status: AC
  Filled 2017-06-22: qty 2

## 2017-06-22 MED ORDER — BUPIVACAINE HCL (PF) 0.5 % IJ SOLN
INTRAMUSCULAR | Status: DC | PRN
  Administered 2017-06-22: 7 mL

## 2017-06-22 MED ORDER — ONDANSETRON HCL 4 MG/2ML IJ SOLN
INTRAMUSCULAR | Status: AC
  Filled 2017-06-22: qty 2

## 2017-06-22 MED ORDER — ONDANSETRON HCL 4 MG/2ML IJ SOLN: 4.0000 mg | Freq: Once | INTRAMUSCULAR | Status: DC | PRN

## 2017-06-22 MED ORDER — FENTANYL CITRATE (PF) 100 MCG/2ML IJ SOLN: 25.0000 ug | INTRAMUSCULAR | Status: DC | PRN

## 2017-06-22 MED ORDER — CEFAZOLIN SODIUM-DEXTROSE 2-4 GM/100ML-% IV SOLN
2.0000 g | INTRAVENOUS | Status: AC
  Administered 2017-06-22: 2 g via INTRAVENOUS

## 2017-06-22 SURGICAL SUPPLY — 37 items
BANDAGE COBAN STERILE 2 (GAUZE/BANDAGES/DRESSINGS) IMPLANT
BLADE SURG 15 STRL LF DISP TIS (BLADE) ×2 IMPLANT
BLADE SURG 15 STRL SS (BLADE) ×2
BNDG COHESIVE 3X5 TAN STRL LF (GAUZE/BANDAGES/DRESSINGS) ×4 IMPLANT
BNDG ESMARK 4X9 LF (GAUZE/BANDAGES/DRESSINGS) ×4 IMPLANT
BNDG GAUZE ELAST 4 BULKY (GAUZE/BANDAGES/DRESSINGS) ×4 IMPLANT
CHLORAPREP W/TINT 26ML (MISCELLANEOUS) ×4 IMPLANT
CORD BIPOLAR FORCEPS 12FT (ELECTRODE) ×4 IMPLANT
COVER BACK TABLE 60X90IN (DRAPES) ×4 IMPLANT
COVER MAYO STAND STRL (DRAPES) ×4 IMPLANT
CUFF TOURNIQUET SINGLE 18IN (TOURNIQUET CUFF) ×4 IMPLANT
DECANTER SPIKE VIAL GLASS SM (MISCELLANEOUS) ×4 IMPLANT
DRAPE EXTREMITY T 121X128X90 (DRAPE) ×4 IMPLANT
DRAPE SURG 17X23 STRL (DRAPES) ×4 IMPLANT
DRSG PAD ABDOMINAL 8X10 ST (GAUZE/BANDAGES/DRESSINGS) ×4 IMPLANT
GAUZE SPONGE 4X4 12PLY STRL (GAUZE/BANDAGES/DRESSINGS) ×4 IMPLANT
GAUZE XEROFORM 1X8 LF (GAUZE/BANDAGES/DRESSINGS) ×4 IMPLANT
GLOVE BIOGEL PI IND STRL 7.0 (GLOVE) ×4 IMPLANT
GLOVE BIOGEL PI IND STRL 8.5 (GLOVE) ×2 IMPLANT
GLOVE BIOGEL PI INDICATOR 7.0 (GLOVE) ×4
GLOVE BIOGEL PI INDICATOR 8.5 (GLOVE) ×2
GLOVE ECLIPSE 6.5 STRL STRAW (GLOVE) ×4 IMPLANT
GLOVE SURG ORTHO 8.0 STRL STRW (GLOVE) ×4 IMPLANT
GOWN STRL REUS W/ TWL LRG LVL3 (GOWN DISPOSABLE) ×2 IMPLANT
GOWN STRL REUS W/TWL LRG LVL3 (GOWN DISPOSABLE) ×2
GOWN STRL REUS W/TWL XL LVL3 (GOWN DISPOSABLE) ×4 IMPLANT
NEEDLE PRECISIONGLIDE 27X1.5 (NEEDLE) ×4 IMPLANT
NS IRRIG 1000ML POUR BTL (IV SOLUTION) ×4 IMPLANT
PACK BASIN DAY SURGERY FS (CUSTOM PROCEDURE TRAY) ×4 IMPLANT
SLING ARM FOAM STRAP LRG (SOFTGOODS) ×4 IMPLANT
STOCKINETTE 4X48 STRL (DRAPES) ×4 IMPLANT
SUT ETHILON 4 0 PS 2 18 (SUTURE) ×4 IMPLANT
SUT VICRYL 4-0 PS2 18IN ABS (SUTURE) IMPLANT
SYR BULB 3OZ (MISCELLANEOUS) ×4 IMPLANT
SYR CONTROL 10ML LL (SYRINGE) ×4 IMPLANT
TOWEL OR 17X24 6PK STRL BLUE (TOWEL DISPOSABLE) ×8 IMPLANT
UNDERPAD 30X30 (UNDERPADS AND DIAPERS) IMPLANT

## 2017-06-22 NOTE — Discharge Instructions (Addendum)
Post Anesthesia Home Care Instructions  Activity: Get plenty of rest for the remainder of the day. A responsible individual must stay with you for 24 hours following the procedure.  For the next 24 hours, DO NOT: -Drive a car -Operate machinery -Drink alcoholic beverages -Take any medication unless instructed by your physician -Make any legal decisions or sign important papers.  Meals: Start with liquid foods such as gelatin or soup. Progress to regular foods as tolerated. Avoid greasy, spicy, heavy foods. If nausea and/or vomiting occur, drink only clear liquids until the nausea and/or vomiting subsides. Call your physician if vomiting continues.  Special Instructions/Symptoms: Your throat may feel dry or sore from the anesthesia or the breathing tube placed in your throat during surgery. If this causes discomfort, gargle with warm salt water. The discomfort should disappear within 24 hours.  If you had a scopolamine patch placed behind your ear for the management of post- operative nausea and/or vomiting:  1. The medication in the patch is effective for 72 hours, after which it should be removed.  Wrap patch in a tissue and discard in the trash. Wash hands thoroughly with soap and water. 2. You may remove the patch earlier than 72 hours if you experience unpleasant side effects which may include dry mouth, dizziness or visual disturbances. 3. Avoid touching the patch. Wash your hands with soap and water after contact with the patch.    [facility] Instructions Hand Surgery  Wound Care: Keep your hand elevated above the level of your heart.  Do not allow it to dangle by your side.  Keep the dressing dry and do not remove it unless your doctor advises you to do so.  He will usually change it at the time of your post-op visit.  Moving your fingers is advised to stimulate circulation but will depend on the site of your surgery.  If you have a splint applied, your doctor will advise you  regarding movement.  Activity: Do not drive or operate machinery today.  Rest today and then you may return to your normal activity and work as indicated by your physician.  Diet:  Drink liquids today or eat a light diet.  You may resume a regular diet tomorrow.    General expectations: Pain for two to three days. Fingers may become slightly swollen.  Call your doctor if any of the following occur: Severe pain not relieved by pain medication. Elevated temperature. Dressing soaked with blood. Inability to move fingers. White or bluish color to fingers.  Regional Anesthesia Blocks  1. Numbness or the inability to move the "blocked" extremity may last from 3-48 hours after placement. The length of time depends on the medication injected and your individual response to the medication. If the numbness is not going away after 48 hours, call your surgeon.  2. The extremity that is blocked will need to be protected until the numbness is gone and the  Strength has returned. Because you cannot feel it, you will need to take extra care to avoid injury. Because it may be weak, you may have difficulty moving it or using it. You may not know what position it is in without looking at it while the block is in effect.  3. For blocks in the legs and feet, returning to weight bearing and walking needs to be done carefully. You will need to wait until the numbness is entirely gone and the strength has returned. You should be able to move your leg and   foot normally before you try and bear weight or walk. You will need someone to be with you when you first try to ensure you do not fall and possibly risk injury.  4. Bruising and tenderness at the needle site are common side effects and will resolve in a few days.  5. Persistent numbness or new problems with movement should be communicated to the surgeon or the [facility] ([phone])/ [facility] ([phone]).  

## 2017-06-22 NOTE — H&P (Signed)
[name] [name] is an 41 y.o. female.   Chief Complaint: numbness and trigger thumb right HPI: [name] [name] is a 41 year old right-hand-dominant former patient who has not been seen in over 3 years. She comes in with a complaint of elbow pain bilaterally. She states this burning in nature over the lateral sides of the elbows on each side left greater than right this been going on for 6 months to a year on her left side slightly less on her right she has a VAS score of 8/10 on her right 5/10 on her left it is not constant. She recalls no history of injuries to the elbow or neck. She states extension and flexion of the elbow are affected with the pain and she does not have good mobility secondary to it. She has a history of complex regional pain in her left foot after surgery due to by Dr. Theodosia PalingPetry. She is taking her [facility] for her complex regional pain and other neurologic problems. She has no history of diabetes or gout. She has a history of thyroid problems he is on Synthroid. She is on Percocet for her complex regional pain treated by her neurologist. [name] nothing seems to make it better or worse. Patient only awakens her at night.She states that the lateral epicondylitis has settled down on each side. It is tolerable at the present time. [facility] This reveals a carpal tunnel syndrome on her right side with a sensory delay of greater than 6 to the median nerve. She has been wearing a splint for her thumb. She continues to complain of catching which has improved. She is having pain only when it catches. There is moderate in nature. Complaining of numbness and tingling to her fingertips. She does have changes in her cervical spine on her nerve conduction. She is not complaining of any burning pain on her right side for complex regional pain is all associated to the left side at the present time.           Past Medical History:   Diagnosis Date  . Anxiety   . Arthritis    ankle, left wrist, back  . Diverticulitis   . History of removal of ovarian cyst    87  . History of skull fracture age 89   no residual effects  . Hyperlipidemia   . Hypothyroidism   . Neuromuscular disorder (HCC)   . Stenosing tenosynovitis of finger 10/2012   right middle finger  . Thyroid disease   . Wears partial dentures    upper    Past Surgical History:  Procedure Laterality Date  . ABDOMINAL ADHESION SURGERY    . ABDOMINAL HYSTERECTOMY  2001   complete  . ABDOMINAL HYSTERECTOMY    . APPENDECTOMY    . CHOLECYSTECTOMY    . gum cyst removal    . LAPAROSCOPY    . left ankle ligament repair    . MOUTH SURGERY     removal of cysts - x 4 surgeries  . PARTIAL HYSTERECTOMY  1989  . THYROIDECTOMY    . TOTAL THYROIDECTOMY    . TRIGGER FINGER RELEASE Right 11/18/2012   Procedure: RELEASE TRIGGER FINGER/A-1 PULLEY;  Surgeon: Nicki ReaperGary R Bellamy Judson, MD;  Location: [facility];  Service: Orthopedics;  Laterality: Right;  . TRIGGER FINGER RELEASE    . WRIST ARTHROSCOPY W/ TRIANGULAR FIBROCARTILAGE REPAIR  03/25/2011   and debridement LT tear    History  reviewed. No pertinent family history. Social History:  reports that she has been smoking Cigarettes.  She has a 20.00 pack-year smoking history. She has never used smokeless tobacco. She reports that she does not drink alcohol or use drugs.  Allergies:  Allergies  Allergen Reactions  . Cortisone     Says her wrist up to elbow turned blue.  Has had some type of stroid in back and foot though without the same reaction    Medications Prior to Admission  Medication Sig Dispense Refill  . atorvastatin (LIPITOR) 40 MG tablet Take 40 mg by mouth daily.    Marland Kitchen escitalopram (LEXAPRO) 10 MG tablet Take 10 mg by mouth daily.    Marland Kitchen gabapentin (NEURONTIN) 300 MG capsule Take 300 mg by mouth. 1 q am 1 at lunch and 2 at hs    . oxyCODONE-acetaminophen (PERCOCET) 10-325 MG tablet Take 1 tablet  by mouth every 4 (four) hours as needed for pain.    Marland Kitchen levothyroxine (SYNTHROID, LEVOTHROID) 112 MCG tablet Take 112 mcg by mouth daily.      No results found for this or any previous visit (from the past 48 hour(s)).  No results found.   Pertinent items are noted in HPI.  Height  (1.549 m), weight 72.6 kg (160 lb).  General appearance: alert, cooperative and appears stated age Head: Normocephalic, without obvious abnormality Neck: no JVD Resp: clear to auscultation bilaterally Cardio: regular rate and rhythm, S1, S2 normal, no murmur, click, rub or gallop GI: soft, non-tender; bowel sounds normal; no masses,  no organomegaly Extremities: catching right thumb numbness right hand Pulses: 2+ and symmetric Skin: Skin color, texture, turgor normal. No rashes or lesions Neurologic: Grossly normal Incision/Wound: na  Assessment/Plan Assessment:  1. Lateral epicondylitis of both elbows  2. Trigger finger of right thumb  3. Carpal tunnel syndrome of right wrist    Plan: She is unable to tolerate injections. We have discussed possible surgical decompression of the median nerve and a release of the A1 pulley of her right thumb. She would like to proceed to have this done. She is advised that there is no guarantee to the surgery the possibility of infection recurrence injury to arteries nerves tendons complete relief symptoms dystrophy would like place her on a preoperative pack of Celebrex. Questions are encouraged and answered to her. The history she is scheduled for release A1 pulley right thumb carpal tunnel release right hand as an outpatient under block.      [name] [name] R 06/22/2017, 9:05 AM

## 2017-06-22 NOTE — Transfer of Care (Signed)
Immediate Anesthesia Transfer of Care Note  Patient: [name] [name]  Procedure(s) Performed: Procedure(s) with comments: RIGHT CARPAL TUNNEL RELEASE (Right) - AXILLARY BLOCK RELEASE TRIGGER FINGER/A-1 PULLEY RIGHT THUMB (Right)  Patient Location: PACU  Anesthesia Type:MAC combined with regional for post-op pain  Level of Consciousness: awake, alert  and oriented  Airway & Oxygen Therapy: Patient Spontanous Breathing and Patient connected to face mask oxygen  Post-op Assessment: Report given to RN and Post -op Vital signs reviewed and stable  Post vital signs: Reviewed and stable  Last Vitals:  Vitals:   06/22/17 1148 06/22/17 1149  BP:    Pulse: 83 80  Resp:  13  Temp:    SpO2: 100% 100%    Last Pain:  Vitals:   06/22/17 0944  TempSrc:   PainSc: 3       Patients Stated Pain Goal: 3 (49/70/26 3785)  Complications: No apparent anesthesia complications

## 2017-06-22 NOTE — Brief Op Note (Signed)
06/22/2017  11:43 AM  PATIENT:  [name] [name]  41 y.o. female  PRE-OPERATIVE DIAGNOSIS:  CARPAL TUNNEL SYNDROME,STENOSISN TENOSYNOVITIS RIGHT THUMB  POST-OPERATIVE DIAGNOSIS:  CARPAL TUNNEL SYNDROME,STENOSISN TENOSYNOVITIS RIGHT THUMB  PROCEDURE:  Procedure(s) with comments: RIGHT CARPAL TUNNEL RELEASE (Right) - AXILLARY BLOCK RELEASE TRIGGER FINGER/A-1 PULLEY RIGHT THUMB (Right)  SURGEON:  Surgeon(s) and Role:    Cindee Salt* Ruddy Swire, MD - Primary  PHYSICIAN ASSISTANT:   ASSISTANTS: none   ANESTHESIA:   local, regional and IV sedation  EBL:  No intake/output data recorded.  BLOOD ADMINISTERED:none  DRAINS: none   LOCAL MEDICATIONS USED:  BUPIVICAINE   SPECIMEN:  No Specimen  DISPOSITION OF SPECIMEN:  N/A  COUNTS:  YES  TOURNIQUET:   Total Tourniquet Time Documented: Forearm (Right) - 22 minutes Total: Forearm (Right) - 22 minutes   DICTATION: .Other Dictation: Dictation Number [id number]  PLAN OF CARE: Discharge to home after PACU  PATIENT DISPOSITION:  PACU - hemodynamically stable.

## 2017-06-22 NOTE — Op Note (Signed)
Dictation Number [id number]

## 2017-06-22 NOTE — Anesthesia Procedure Notes (Signed)
Anesthesia Regional Block: Axillary brachial plexus block   Pre-Anesthetic Checklist: ,, timeout performed, Correct Patient, Correct Site, Correct Laterality, Correct Procedure,, site marked, risks and benefits discussed, Surgical consent,  Pre-op evaluation,  At surgeon's request and post-op pain management  Laterality: Right  Prep: chloraprep       Needles:  Injection technique: Single-shot  Needle Type: Echogenic Stimulator Needle     Needle Length: 9cm  Needle Gauge: 21     Additional Needles:   Procedures: ultrasound guided,,,,,,,,  Narrative:  Start time: 06/22/2017 10:40 AM End time: 06/22/2017 10:50 AM Injection made incrementally with aspirations every 5 mL.  Performed by: Personally  Anesthesiologist: Karna ChristmasELLENDER, RYAN P  Additional Notes: Functioning IV was confirmed and monitors were applied.  A 90mm 21ga Arrow echogenic stimulator needle was used. Sterile prep, hand hygiene and sterile gloves were used.  Negative aspiration and negative test dose prior to incremental administration of local anesthetic. The patient tolerated the procedure well.

## 2017-06-22 NOTE — Anesthesia Postprocedure Evaluation (Signed)
Anesthesia Post Note  Patient: [name] [name]  Procedure(s) Performed: Procedure(s) (LRB): RIGHT CARPAL TUNNEL RELEASE (Right) RELEASE TRIGGER FINGER/A-1 PULLEY RIGHT THUMB (Right)     Patient location during evaluation: PACU Anesthesia Type: Regional and MAC Level of consciousness: awake and alert Pain management: pain level controlled Vital Signs Assessment: post-procedure vital signs reviewed and stable Respiratory status: spontaneous breathing, nonlabored ventilation, respiratory function stable and patient connected to nasal cannula oxygen Cardiovascular status: stable and blood pressure returned to baseline Anesthetic complications: no    Last Vitals:  Vitals:   06/22/17 1200 06/22/17 1230  BP: 109/80 105/80  Pulse: 87 83  Resp: 17 18  Temp:  36.6 C  SpO2: 96% 99%    Last Pain:  Vitals:   06/22/17 1230  TempSrc:   PainSc: 2                  Yahia Bottger P Sammantha Mehlhaff

## 2017-06-22 NOTE — Progress Notes (Signed)
Assisted Dr. Ellender with right, ultrasound guided, axillary block. Side rails up, monitors on throughout procedure. See vital signs in flow sheet. Tolerated Procedure well. 

## 2017-06-22 NOTE — Anesthesia Preprocedure Evaluation (Addendum)
Anesthesia Evaluation  Patient identified by MRN, date of birth, ID band Patient awake    History of Anesthesia Complications Negative for: history of anesthetic complications  Airway Mallampati: III   Neck ROM: Full    Dental  (+) Teeth Intact   Pulmonary Current Smoker,    breath sounds clear to auscultation       Cardiovascular hypertension, Pt. on medications  Rhythm:Regular Rate:Normal     Neuro/Psych Anxiety  Neuromuscular disease negative neurological ROS     GI/Hepatic negative GI ROS, Neg liver ROS,   Endo/Other  Hypothyroidism   Renal/GU negative Renal ROS     Musculoskeletal  (+) Arthritis ,   Abdominal   Peds  Hematology   Anesthesia Other Findings Hyperlipidemia  Reproductive/Obstetrics                            Anesthesia Physical  Anesthesia Plan  ASA: II  Anesthesia Plan: MAC and Regional   Post-op Pain Management:  Regional for Post-op pain   Induction:   PONV Risk Score and Plan: 1 and Propofol infusion, Treatment may vary due to age or medical condition, Ondansetron and Midazolam  Airway Management Planned: Natural Airway  Additional Equipment:   Intra-op Plan:   Post-operative Plan:   Informed Consent: I have reviewed the patients History and Physical, chart, labs and discussed the procedure including the risks, benefits and alternatives for the proposed anesthesia with the patient or authorized representative who has indicated his/her understanding and acceptance.   Dental advisory given  Plan Discussed with: CRNA and Surgeon  Anesthesia Plan Comments:       Anesthesia Quick Evaluation

## 2017-06-23 ENCOUNTER — Encounter (HOSPITAL_BASED_OUTPATIENT_CLINIC_OR_DEPARTMENT_OTHER): Payer: Self-pay | Admitting: Orthopedic Surgery

## 2017-06-23 NOTE — Op Note (Signed)
[name] [name]:  [name] [name]             ACCOUNT NO.:  [id number]  MEDICAL RECORD NO.:  [id number]  LOCATION:                                 FACILITY:  PHYSICIAN:  Cindee SaltGary Dorine Duffey, M.D.            DATE OF BIRTH:  DATE OF PROCEDURE:  06/22/2017 DATE OF DISCHARGE:                              OPERATIVE REPORT   POSTOPERATIVE DIAGNOSIS:  Carpal tunnel syndrome, with triggering right thumb, right side.  POSTOPERATIVE DIAGNOSIS:  Carpal tunnel syndrome, with triggering right thumb, right side.  OPERATION:  Carpal tunnel release with release of A1 pulley, right thumb.  SURGEON:  Cindee SaltGary Jaskiran Pata, M.D.  ANESTHESIA:  Supraclavicular block with IV sedation and local infiltration with Sensorcaine.  [facility]  HISTORY:  The patient is a 41 year old female with a history of carpal tunnel syndrome and triggering of her right thumb.  This has not responded to conservative treatment.  Nerve conductions are positive. She has a history of complex regional pain on her opposite side.  She is admitted for carpal tunnel release done under regional anesthesia for sympathectomy along with local and sedation.  In the preoperative area, the patient is seen, the extremity marked by both patient and surgeon, antibiotic given.  DESCRIPTION OF PROCEDURE:  The patient was brought to the operating room where a supraclavicular block then carried out in the preoperative area. She was prepped using ChloraPrep in a supine position with the right arm free.  The arm was prepped with ChloraPrep.  She was prepped and draped in a supine position.  A 3-minute dry time allowed.  Time-out taken confirming the patient and procedure.  The limb was exsanguinated with an Esmarch bandage.  Tourniquet placed high on the arm was inflated to 250 mmHg.  A transverse incision was made for release of the A1 pulley. She had some feeling, and local infiltration with 0.5% bupivacaine without epinephrine was  given along with an injection to the carpal tunnel area.  Approximately 8 mL was used total.  The dissection was carried down identifying neurovascular bundles radially and ulnarly on the A1 pulley of the thumb area.  The A1 pulley was identified, isolated, and released on its radial aspect with care to protect the oblique pulley.  The synovial tissue proximally was separated with blunt dissection.  The thumb was placed through a full range of motion.  No further triggering was noted.  The wound was copiously irrigated with saline, and the skin was closed with interrupted 4-0 nylon sutures.  A separate incision was then made for carpal tunnel release, longitudinal in nature, to the palm and carried down through subcutaneous tissue. Bleeders were electrocauterized with bipolar.  The palmar fascia was split.  Retractors were placed, and superficial palmar arch was identified distally.  The flexor tendon to the ring and little finger was identified.  This was retracted radially and the ulnar nerve ulnarly.  The flexor retinaculum was then incised with sharp dissection on its ulnar border.  A right angle and Sewall retractor were placed between skin and forearm fascia.  The deep tissues were dissected free with blunt dissection, and blunt scissors  were then used to release the proximal aspect of the flexor retinaculum, distal forearm fascia for approximately 2 cm and proximal to the wrist crease under direct vision. The canal was explored.  An area of compression to the nerve was apparent.  No further lesions were identified.  Motor branch entered into muscle distally.  The wound was copiously irrigated with saline. This was then closed with interrupted 4-0 nylon sutures.  A sterile compressive dressing with the fingers free was applied.  On deflation of the tourniquet, all fingers immediately pinked.  She was taken to the recovery room for observation in satisfactory condition.  She will be  discharged to home to return to the [facility] in 1 week, on Percocet 5/325.          ______________________________ Cindee Salt, M.D.     GK/MEDQ  D:  06/22/2017  T:  06/23/2017  Job:  [id number]

## 2017-06-23 NOTE — Addendum Note (Signed)
Addendum  created 06/23/17 1321 by Lance CoonWebster, [place], CRNA   Charge Capture section accepted

## 2018-07-21 DIAGNOSIS — Z720 Tobacco use: Secondary | ICD-10-CM | POA: Insufficient documentation

## 2018-12-14 ENCOUNTER — Encounter (INDEPENDENT_AMBULATORY_CARE_PROVIDER_SITE_OTHER): Payer: Self-pay | Admitting: Family Medicine

## 2018-12-14 ENCOUNTER — Ambulatory Visit (INDEPENDENT_AMBULATORY_CARE_PROVIDER_SITE_OTHER): Payer: Self-pay

## 2018-12-14 ENCOUNTER — Ambulatory Visit (INDEPENDENT_AMBULATORY_CARE_PROVIDER_SITE_OTHER): Payer: 59 | Admitting: Family Medicine

## 2018-12-14 DIAGNOSIS — S82831A Other fracture of upper and lower end of right fibula, initial encounter for closed fracture: Secondary | ICD-10-CM | POA: Diagnosis not present

## 2018-12-14 DIAGNOSIS — G90511 Complex regional pain syndrome I of right upper limb: Secondary | ICD-10-CM | POA: Diagnosis not present

## 2018-12-14 DIAGNOSIS — M25571 Pain in right ankle and joints of right foot: Secondary | ICD-10-CM | POA: Diagnosis not present

## 2018-12-14 DIAGNOSIS — S8254XA Nondisplaced fracture of medial malleolus of right tibia, initial encounter for closed fracture: Secondary | ICD-10-CM

## 2018-12-14 NOTE — Progress Notes (Signed)
Office Visit Note   Patient: [name] [name]           Date of Birth: 1976-04-07           MRN: [id number] Visit Date: 12/14/2018 Requested by: Record, Emmit Alexanders address on file PCP: Garnetta Buddy, MD  Subjective: Chief Complaint  Patient presents with  . Right Ankle - Pain    DOI 12/04/2018 - was walking out of a room in her house - while making a turn, her "right foot/leg went 1 way & the rest of her went the other way."  Swelling and pain in the ankle/foot.    HPI: She is a 43 year old with right ankle pain.  On February 23 she was walking in her house, pivoted on her right foot to turn to the left and her right foot and ankle gave way and she fell to the ground.  Immediate severe pain in her ankle and her knee.  She went the next day to an urgent care where x-rays showed possible avulsion fractures of medial and lateral malleoli.  She was given a fracture boot and now presents for evaluation.  She is still unable to bear full weight.  Pain is mostly on the medial ankle.  No previous problems with this ankle.  She is multiple ankle sprains on the left and a history of complex regional pain syndrome in the left lower extremity and the right upper extremity.              ROS: Otherwise noncontributory  Objective: Vital Signs: There were no vitals taken for this visit.  Physical Exam:  Right ankle: Slight tenderness at the proximal fibula.  Moderate swelling and bruising in the ankle especially on the medial side.  Exquisitely tender near the medial malleolus.  Also mildly tender over the lateral malleolus.  No warmth or erythema in the ankle, no sweating of the skin.  Dr. August Saucer evaluated syndesmosis stability and felt that it was loose.  Imaging: X-rays right ankle:  Non-displaced medial malleolus fracture, intact mortise.  Previous x-rays reviewed on CD: She has old appearing ossific density on the lateral ankle at the tip of the malleolus.  There is also a medial malleolus  avulsion fracture nondisplaced.  Knee x-rays do show a proximal fibula fracture, nondisplaced.   Assessment & Plan: 1.  1-1/2 weeks s/p right ankle medial malleolus avulsion fracture and proximal fibular fracture with probable syndesmosis injury.  Risk for development of RSD in right ankle. - MRI ankle.  Strict NWB in [facility].   - Dr. August Saucer for surgery if syndesmosis unstable. - X-Rays in 2 weeks if MRI is favorable. - Encouraged to quit smoking.     Procedures: No procedures performed  No notes on file     PMFS History: Patient Active Problem List   Diagnosis Date Noted  . Complex regional pain syndrome I of upper limb 03/29/2014   Past Medical History:  Diagnosis Date  . Anxiety   . Arthritis    ankle, left wrist, back  . Diverticulitis   . History of removal of ovarian cyst    89  . History of skull fracture age 71   no residual effects  . Hyperlipidemia   . Hypothyroidism   . Neuromuscular disorder (HCC)   . Stenosing tenosynovitis of finger 10/2012   right middle finger  . Thyroid disease   . Wears partial dentures    upper    History reviewed. No pertinent family history.  Past Surgical History:  Procedure Laterality Date  . ABDOMINAL ADHESION SURGERY    . ABDOMINAL HYSTERECTOMY  2001   complete  . ABDOMINAL HYSTERECTOMY    . APPENDECTOMY    . CARPAL TUNNEL RELEASE Right 06/22/2017   Procedure: RIGHT CARPAL TUNNEL RELEASE;  Surgeon: Cindee Salt, MD;  Location: [facility];  Service: Orthopedics;  Laterality: Right;  AXILLARY BLOCK  . CHOLECYSTECTOMY    . gum cyst removal    . LAPAROSCOPY    . left ankle ligament repair    . MOUTH SURGERY     removal of cysts - x 4 surgeries  . PARTIAL HYSTERECTOMY  1989  . THYROIDECTOMY    . TOTAL THYROIDECTOMY    . TRIGGER FINGER RELEASE Right 11/18/2012   Procedure: RELEASE TRIGGER FINGER/A-1 PULLEY;  Surgeon: Nicki Reaper, MD;  Location: [facility];  Service: Orthopedics;  Laterality: Right;    . TRIGGER FINGER RELEASE    . TRIGGER FINGER RELEASE Right 06/22/2017   Procedure: RELEASE TRIGGER FINGER/A-1 PULLEY RIGHT THUMB;  Surgeon: Cindee Salt, MD;  Location: [facility];  Service: Orthopedics;  Laterality: Right;  . WRIST ARTHROSCOPY W/ TRIANGULAR FIBROCARTILAGE REPAIR  03/25/2011   and debridement LT tear   Social History   Occupational History  . Not on file  Tobacco Use  . Smoking status: Current Every Day Smoker    Packs/day: 1.00    Years: 20.00    Pack years: 20.00    Types: Cigarettes  . Smokeless tobacco: Never Used  Substance and Sexual Activity  . Alcohol use: No  . Drug use: No  . Sexual activity: Not on file

## 2018-12-25 ENCOUNTER — Other Ambulatory Visit: Payer: Self-pay

## 2018-12-25 ENCOUNTER — Ambulatory Visit
Admission: RE | Admit: 2018-12-25 | Discharge: 2018-12-25 | Disposition: A | Discharge status: Home / Self Care | Payer: Medicare Other | Source: Ambulatory Visit | Attending: Family Medicine | Admitting: Family Medicine

## 2018-12-25 DIAGNOSIS — M25571 Pain in right ankle and joints of right foot: Secondary | ICD-10-CM

## 2018-12-26 ENCOUNTER — Telehealth (INDEPENDENT_AMBULATORY_CARE_PROVIDER_SITE_OTHER): Payer: Self-pay | Admitting: Family Medicine

## 2018-12-26 NOTE — Telephone Encounter (Signed)
I spoke to the patient about her right ankle MRI scan.  She has a complete tear of the anterior inferior tibiofibular ligament.  Lateral ligaments are otherwise intact.  Syndesmosis does not appear widened and there is no bone marrow edema around it.  I spoke to the radiologist about the MRI as well.  I will have Dr. August Saucer reviewed the images as well to determine whether this can be treated nonsurgically.

## 2018-12-29 ENCOUNTER — Encounter (INDEPENDENT_AMBULATORY_CARE_PROVIDER_SITE_OTHER): Payer: Self-pay

## 2018-12-29 ENCOUNTER — Telehealth (INDEPENDENT_AMBULATORY_CARE_PROVIDER_SITE_OTHER): Payer: Self-pay | Admitting: Family Medicine

## 2018-12-29 DIAGNOSIS — E785 Hyperlipidemia, unspecified: Secondary | ICD-10-CM | POA: Insufficient documentation

## 2018-12-29 DIAGNOSIS — L03119 Cellulitis of unspecified part of limb: Secondary | ICD-10-CM | POA: Insufficient documentation

## 2018-12-29 DIAGNOSIS — F410 Panic disorder [episodic paroxysmal anxiety] without agoraphobia: Secondary | ICD-10-CM | POA: Insufficient documentation

## 2018-12-29 DIAGNOSIS — R32 Unspecified urinary incontinence: Secondary | ICD-10-CM | POA: Insufficient documentation

## 2018-12-29 DIAGNOSIS — G479 Sleep disorder, unspecified: Secondary | ICD-10-CM | POA: Insufficient documentation

## 2018-12-29 DIAGNOSIS — E559 Vitamin D deficiency, unspecified: Secondary | ICD-10-CM | POA: Insufficient documentation

## 2018-12-29 DIAGNOSIS — F339 Major depressive disorder, recurrent, unspecified: Secondary | ICD-10-CM | POA: Insufficient documentation

## 2018-12-29 DIAGNOSIS — D751 Secondary polycythemia: Secondary | ICD-10-CM | POA: Insufficient documentation

## 2018-12-29 NOTE — Telephone Encounter (Signed)
It looks like Dr. August Saucer was consulted. Do you know if he reviewed the MRI yet?

## 2018-12-29 NOTE — Telephone Encounter (Signed)
Patient called asked for a call back concerning her test results. Patient said another provider was suppose to be consulted concerning her surgery. The number to contact patient is [phone]

## 2018-12-29 NOTE — Telephone Encounter (Signed)
I called and advised the patient. She is still in the [facility]. She does have a fracture boot to go into, though.  I scheduled an appointment for her to come in tomorrow morning to see Dr. Prince Rome for a recheck of the ankle. She is asking for something for pain.  Her normal pain medication is not helping the ankle pain much (lasts for only 1 hour).  I told her this will be addressed tomorrow.  She will bring her fracture boot with her.

## 2018-12-29 NOTE — Telephone Encounter (Signed)
Dr. August Saucer agrees, no need for surgery.  Would remain non-weight bearing in a fracture boot for a couple weeks, then we will re-examine.  If improving, we'll allow her to start weight bearing but still in fracture boot for a few more weeks.

## 2018-12-30 ENCOUNTER — Encounter (INDEPENDENT_AMBULATORY_CARE_PROVIDER_SITE_OTHER): Payer: Self-pay | Admitting: Family Medicine

## 2018-12-30 ENCOUNTER — Ambulatory Visit (INDEPENDENT_AMBULATORY_CARE_PROVIDER_SITE_OTHER): Payer: Medicare Other | Admitting: Family Medicine

## 2018-12-30 ENCOUNTER — Other Ambulatory Visit: Payer: Self-pay

## 2018-12-30 DIAGNOSIS — Z79891 Long term (current) use of opiate analgesic: Secondary | ICD-10-CM

## 2018-12-30 DIAGNOSIS — M25571 Pain in right ankle and joints of right foot: Secondary | ICD-10-CM

## 2018-12-30 DIAGNOSIS — S8254XA Nondisplaced fracture of medial malleolus of right tibia, initial encounter for closed fracture: Secondary | ICD-10-CM

## 2018-12-30 MED ORDER — OXYCODONE HCL ER 10 MG PO T12A: 10.0000 mg | EXTENDED_RELEASE_TABLET | Freq: Two times a day (BID) | ORAL | 0 refills | Status: DC

## 2018-12-30 NOTE — Progress Notes (Signed)
Office Visit Note   Patient: [name] [name]           Date of Birth: [birth year]           MRN: [id number] Visit Date: 12/30/2018 Requested by: Garnetta Buddy, MD [address] [place], [place] [address] PCP: Garnetta Buddy, MD  Subjective: Chief Complaint  Patient presents with  . Right Ankle - Follow-up    DOI 12/04/2018, in [facility] since 12/14/18.    HPI: She is here for follow-up right ankle syndesmosis injury.  Her cast is causing quite a bit of pain.  She has not been able to reach her neurologist who prescribes her pain medicine and she would like something to help her through the weekend.  She takes Percocet 10 mg every 4 hours as needed.              ROS:  All other systems were reviewed and are negative.  Objective: Vital Signs: There were no vitals taken for this visit.  Physical Exam:  General:  Alert and oriented, in no acute distress. Pulm:  Breathing unlabored. Psy:  Normal mood, congruent affect. Skin: Slight bruising on the posterior medial aspect of her right ankle but no skin breakdown.  No temperature change, no redness. Did not stress the right ankle ligaments today.  Imaging: None today.  Assessment & Plan: 1.  Right ankle syndesmosis sprain with acceptable alignment per MRI scan, and high risk for development of RSD with surgical intervention -We will keep her nonweightbearing in a fracture boot.  Follow-up in 3 weeks for recheck and gentle examination. -One-time prescription for OxyContin 10 mg twice daily, number 10 tablets with no refills.  She will contact her neurologist for pain management.     Procedures: No procedures performed  No notes on file     PMFS History: Patient Active Problem List   Diagnosis Date Noted  . Cellulitis of hand 12/29/2018  . Erythrocytosis 12/29/2018  . Hyperlipidemia 12/29/2018  . Panic attacks 12/29/2018  . Recurrent depression (HCC) 12/29/2018  . Sleep disorder 12/29/2018  .  Urine incontinence 12/29/2018  . Vitamin D deficiency 12/29/2018  . Tobacco abuse 07/21/2018  . Trigger finger of right thumb 05/12/2017  . Lateral epicondylitis of both elbows 02/17/2017  . Pain 02/17/2017  . 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 Essential hypertension 12/09/2015  . Anxiety 12/06/2015  . Complex regional pain syndrome I of upper limb 03/29/2014  . Lumbar back pain with radiculopathy affecting left lower extremity 03/19/2014  . Baker's cyst of knee 05/18/2013  . Carpal tunnel syndrome of right wrist 05/18/2013  . Left ankle pain 02/02/2013  . Constipation, chronic 07/29/2012  . Post-surgical hypothyroidism 04/28/2012   Past Medical History:  Diagnosis Date  . Anxiety   . Arthritis    ankle, left wrist, back  . Diverticulitis   . History of removal of ovarian cyst    89  . History of skull fracture age 29   no residual effects  . Hyperlipidemia   . Hypothyroidism   . Neuromuscular disorder (HCC)   . Stenosing tenosynovitis of finger 10/2012   right middle finger  . Thyroid disease   . Wears partial dentures    upper    No family history on file.  Past Surgical History:  Procedure Laterality Date  . ABDOMINAL ADHESION SURGERY    . ABDOMINAL HYSTERECTOMY  2001   complete  . ABDOMINAL HYSTERECTOMY    . APPENDECTOMY    .  CARPAL TUNNEL RELEASE Right 06/22/2017   Procedure: RIGHT CARPAL TUNNEL RELEASE;  Surgeon: Cindee Salt, MD;  Location: [facility];  Service: Orthopedics;  Laterality: Right;  AXILLARY BLOCK  . CHOLECYSTECTOMY    . gum cyst removal    . LAPAROSCOPY    . left ankle ligament repair    . MOUTH SURGERY     removal of cysts - x 4 surgeries  . PARTIAL HYSTERECTOMY  1989  . THYROIDECTOMY    . TOTAL THYROIDECTOMY    . TRIGGER FINGER RELEASE Right 11/18/2012   Procedure: RELEASE TRIGGER FINGER/A-1 PULLEY;  Surgeon: Nicki Reaper, MD;  Location: [facility];  Service: Orthopedics;  Laterality: Right;  . TRIGGER FINGER RELEASE     . TRIGGER FINGER RELEASE Right 06/22/2017   Procedure: RELEASE TRIGGER FINGER/A-1 PULLEY RIGHT THUMB;  Surgeon: Cindee Salt, MD;  Location: [facility];  Service: Orthopedics;  Laterality: Right;  . WRIST ARTHROSCOPY W/ TRIANGULAR FIBROCARTILAGE REPAIR  03/25/2011   and debridement LT tear   Social History   Occupational History  . Not on file  Tobacco Use  . Smoking status: Current Every Day Smoker    Packs/day: 1.00    Years: 20.00    Pack years: 20.00    Types: Cigarettes  . Smokeless tobacco: Never Used  Substance and Sexual Activity  . Alcohol use: No  . Drug use: No  . Sexual activity: Not on file

## 2019-01-18 ENCOUNTER — Telehealth (INDEPENDENT_AMBULATORY_CARE_PROVIDER_SITE_OTHER): Payer: Self-pay | Admitting: Radiology

## 2019-01-18 NOTE — Telephone Encounter (Signed)
Left message for patient to call back. Please ask screening questions, thanks!  In the last 7 days...Marland KitchenMarland KitchenMarland Kitchen Fever or chills? Cough? Nausea, vomiting, abdominal pain, diarrhea? Exposed to anyone with positive COVID-19? Traveled recently in or out of state?

## 2019-01-18 NOTE — Telephone Encounter (Signed)
Answered no to all screening questions. 

## 2019-01-19 ENCOUNTER — Ambulatory Visit (INDEPENDENT_AMBULATORY_CARE_PROVIDER_SITE_OTHER): Payer: Self-pay | Admitting: Family Medicine

## 2019-01-19 ENCOUNTER — Ambulatory Visit (INDEPENDENT_AMBULATORY_CARE_PROVIDER_SITE_OTHER): Payer: 59

## 2019-01-19 ENCOUNTER — Ambulatory Visit (INDEPENDENT_AMBULATORY_CARE_PROVIDER_SITE_OTHER): Payer: Medicare Other | Admitting: Family Medicine

## 2019-01-19 ENCOUNTER — Other Ambulatory Visit: Payer: Self-pay

## 2019-01-19 ENCOUNTER — Encounter (INDEPENDENT_AMBULATORY_CARE_PROVIDER_SITE_OTHER): Payer: Self-pay | Admitting: Family Medicine

## 2019-01-19 VITALS — BP 133/86 | HR 75 | Temp 99.2°F | Ht 60.0 in | Wt 130.0 lb

## 2019-01-19 DIAGNOSIS — M25571 Pain in right ankle and joints of right foot: Secondary | ICD-10-CM

## 2019-01-19 DIAGNOSIS — M79674 Pain in right toe(s): Secondary | ICD-10-CM

## 2019-01-19 NOTE — Progress Notes (Signed)
Office Visit Note   Patient: [name] [name]           Date of Birth: [birth year]           MRN: [id number] Visit Date: 01/19/2019 Requested by: Garnetta Buddy, MD [address] [place], [place] [address] PCP: Garnetta Buddy, MD  Subjective: Chief Complaint  Patient presents with  . Right Ankle - Follow-up  . Follow-up    continued right ankle pain, has improved since being in boot, medial ankle pain with great toe pain. does elevate and takes medication by pain clinic. is able to flex foot now some.     HPI: She is about 6 weeks status post fall resulting in right ankle syndesmosis sprain.  Since last visit she has been restricting weightbearing and is feeling much better.  She still has some pain but nowhere near as much as before.  She complains of pain in the great toe constantly.              ROS: No fevers or chills or respiratory symptoms.  All other systems were reviewed and are negative.  Objective: Vital Signs: BP 133/86 (BP Location: Left Arm, Patient Position: Sitting, Cuff Size: Normal)   Pulse 75   Temp 99.2 F (37.3 C)   Ht 5' (1.524 m)   Wt 130 lb (59 kg)   BMI 25.39 kg/m   Physical Exam:  General:  Alert and oriented, in no acute distress. Pulm:  Breathing unlabored. Psy:  Normal mood, congruent affect. Skin: No skin breakdown, no rash on her skin. Right foot: Great toe is tender to palpation at the proximal phalanx but feels like in normal alignment.  Able to flex and extend it with intact tendon function.  Ankle remains slightly tender over the syndesmosis but Kleiger test feels stable.  Imaging: X-rays great toe right foot: No definite fracture seen.  She does have some arthritis at the IP joint.  Assessment & Plan: 1.  Clinically healing 6 weeks status post right ankle syndesmosis sprain.  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 -Weightbearing as tolerated in fracture boot.  Start doing Thera-Band exercises for  strengthening.  As long as pain steadily improves over the next 3 to 4 weeks she will follow-up as needed.     Procedures: No procedures performed  No notes on file     PMFS History: Patient Active Problem List   Diagnosis Date Noted  . Cellulitis of hand 12/29/2018  . Erythrocytosis 12/29/2018  . Hyperlipidemia 12/29/2018  . Panic attacks 12/29/2018  . Recurrent depression (HCC) 12/29/2018  . Sleep disorder 12/29/2018  . Urine incontinence 12/29/2018  . Vitamin D deficiency 12/29/2018  . Tobacco abuse 07/21/2018  . Trigger finger of right thumb 05/12/2017  . Lateral epicondylitis of both elbows 02/17/2017  . Pain 02/17/2017  . 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 Essential hypertension 12/09/2015  . Anxiety 12/06/2015  . Complex regional pain syndrome I of upper limb 03/29/2014  . Lumbar back pain with radiculopathy affecting left lower extremity 03/19/2014  . Baker's cyst of knee 05/18/2013  . Carpal tunnel syndrome of right wrist 05/18/2013  . Left ankle pain 02/02/2013  . Constipation, chronic 07/29/2012  . Post-surgical hypothyroidism 04/28/2012   Past Medical History:  Diagnosis Date  . Anxiety   . Arthritis    ankle, left wrist, back  . Diverticulitis   . History of removal of ovarian cyst    89  . History of  skull fracture age 46   no residual effects  . Hyperlipidemia   . Hypothyroidism   . Neuromuscular disorder (HCC)   . Stenosing tenosynovitis of finger 10/2012   right middle finger  . Thyroid disease   . Wears partial dentures    upper    History reviewed. No pertinent family history.  Past Surgical History:  Procedure Laterality Date  . ABDOMINAL ADHESION SURGERY    . ABDOMINAL HYSTERECTOMY  2001   complete  . ABDOMINAL HYSTERECTOMY    . APPENDECTOMY    . CARPAL TUNNEL RELEASE Right 06/22/2017   Procedure: RIGHT CARPAL TUNNEL RELEASE;  Surgeon: Cindee SaltKuzma, Gary, MD;  Location: [facility];  Service: Orthopedics;  Laterality: Right;   AXILLARY BLOCK  . CHOLECYSTECTOMY    . gum cyst removal    . LAPAROSCOPY    . left ankle ligament repair    . MOUTH SURGERY     removal of cysts - x 4 surgeries  . PARTIAL HYSTERECTOMY  1989  . THYROIDECTOMY    . TOTAL THYROIDECTOMY    . TRIGGER FINGER RELEASE Right 11/18/2012   Procedure: RELEASE TRIGGER FINGER/A-1 PULLEY;  Surgeon: Nicki ReaperGary R Kuzma, MD;  Location: [facility];  Service: Orthopedics;  Laterality: Right;  . TRIGGER FINGER RELEASE    . TRIGGER FINGER RELEASE Right 06/22/2017   Procedure: RELEASE TRIGGER FINGER/A-1 PULLEY RIGHT THUMB;  Surgeon: Cindee SaltKuzma, Gary, MD;  Location: [facility];  Service: Orthopedics;  Laterality: Right;  . WRIST ARTHROSCOPY W/ TRIANGULAR FIBROCARTILAGE REPAIR  03/25/2011   and debridement LT tear   Social History   Occupational History  . Not on file  Tobacco Use  . Smoking status: Current Every Day Smoker    Packs/day: 1.00    Years: 20.00    Pack years: 20.00    Types: Cigarettes  . Smokeless tobacco: Never Used  Substance and Sexual Activity  . Alcohol use: No  . Drug use: No  . Sexual activity: Not on file

## 2019-05-02 ENCOUNTER — Ambulatory Visit (INDEPENDENT_AMBULATORY_CARE_PROVIDER_SITE_OTHER): Payer: 59 | Admitting: Family Medicine

## 2019-05-02 ENCOUNTER — Encounter: Payer: Self-pay | Admitting: Family Medicine

## 2019-05-02 DIAGNOSIS — M25571 Pain in right ankle and joints of right foot: Secondary | ICD-10-CM

## 2019-05-02 NOTE — Progress Notes (Signed)
Office Visit Note   Patient: [name] [name]           Date of Birth: [birth year]           MRN: [id number] Visit Date: 05/02/2019 Requested by: Angelica Ran, MD [address] [place],  [place] [address] PCP: Angelica Ran, MD  Subjective: Chief Complaint  Patient presents with  . Right Ankle - Pain    Continues to have pain in the medial and anterior ankle. Continues to swell. Has tried to go without the fracture boot, but hurts too much to walk without it. DOI 12/04/2018 (fall)    HPI: She is here with persistent right ankle pain.  It has been about 4-1/2 months since she injured her syndesmosis.  She still is unable to wean out of her fracture boot.  Pain on the anterior and medial aspect of her ankle.               ROS:  All other systems were reviewed and are negative.  Objective: Vital Signs: There were no vitals taken for this visit.  Physical Exam:  General:  Alert and oriented, in no acute distress. Pulm:  Breathing unlabored. Psy:  Normal mood, congruent affect. Skin: No erythema or discoloration compared to the left ankle. Right ankle: She is slightly swollen medially, with tenderness posterior to the medial malleolus.  She is also very tender anteriorly near the syndesmosis.  Syndesmosis squeeze does not cause significant pain today.  She does have pain with passive ankle dorsiflexion and eversion, no detectable instability.  Imaging: None today.  Assessment & Plan: 1.  Almost 5 months status post right ankle syndesmosis sprain with underlying RSD -We will try physical therapy.  If she does not tolerate it, we might need to consider other options.  She has not done well with cortisone in the past, so that may not be an option for her.     Procedures: No procedures performed  No notes on file     PMFS History: Patient Active Problem List   Diagnosis Date Noted  . Cellulitis of hand 12/29/2018  . Erythrocytosis 12/29/2018  .  Hyperlipidemia 12/29/2018  . Panic attacks 12/29/2018  . Recurrent depression ([place]) 12/29/2018  . Sleep disorder 12/29/2018  . Urine incontinence 12/29/2018  . Vitamin D deficiency 12/29/2018  . Tobacco abuse 07/21/2018  . Trigger finger of right thumb 05/12/2017  . Lateral epicondylitis of both elbows 02/17/2017  . Pain 02/17/2017  . 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 Essential hypertension 12/09/2015  . Anxiety 12/06/2015  . Complex regional pain syndrome I of upper limb 03/29/2014  . Lumbar back pain with radiculopathy affecting left lower extremity 03/19/2014  . Baker's cyst of knee 05/18/2013  . Carpal tunnel syndrome of right wrist 05/18/2013  . Left ankle pain 02/02/2013  . Constipation, chronic 07/29/2012  . Post-surgical hypothyroidism 04/28/2012   Past Medical History:  Diagnosis Date  . Anxiety   . Arthritis    ankle, left wrist, back  . Diverticulitis   . History of removal of ovarian cyst    89  . History of skull fracture age 31   no residual effects  . Hyperlipidemia   . Hypothyroidism   . Neuromuscular disorder ([place])   . Stenosing tenosynovitis of finger 10/2012   right middle finger  . Thyroid disease   . Wears partial dentures    upper    History reviewed. No pertinent family history.  Past Surgical History:  Procedure Laterality Date  . ABDOMINAL ADHESION SURGERY    . ABDOMINAL HYSTERECTOMY  2001   complete  . ABDOMINAL HYSTERECTOMY    . APPENDECTOMY    . CARPAL TUNNEL RELEASE Right 06/22/2017   Procedure: RIGHT CARPAL TUNNEL RELEASE;  Surgeon: Cindee SaltKuzma, Gary, MD;  Location: [facility];  Service: Orthopedics;  Laterality: Right;  AXILLARY BLOCK  . CHOLECYSTECTOMY    . gum cyst removal    . LAPAROSCOPY    . left ankle ligament repair    . MOUTH SURGERY     removal of cysts - x 4 surgeries  . PARTIAL HYSTERECTOMY  1989  . THYROIDECTOMY    . TOTAL THYROIDECTOMY    . TRIGGER FINGER RELEASE Right 11/18/2012   Procedure: RELEASE TRIGGER  FINGER/A-1 PULLEY;  Surgeon: Nicki ReaperGary R Kuzma, MD;  Location: [facility];  Service: Orthopedics;  Laterality: Right;  . TRIGGER FINGER RELEASE    . TRIGGER FINGER RELEASE Right 06/22/2017   Procedure: RELEASE TRIGGER FINGER/A-1 PULLEY RIGHT THUMB;  Surgeon: Cindee SaltKuzma, Gary, MD;  Location: [facility];  Service: Orthopedics;  Laterality: Right;  . WRIST ARTHROSCOPY W/ TRIANGULAR FIBROCARTILAGE REPAIR  03/25/2011   and debridement LT tear   Social History   Occupational History  . Not on file  Tobacco Use  . Smoking status: Current Every Day Smoker    Packs/day: 1.00    Years: 20.00    Pack years: 20.00    Types: Cigarettes  . Smokeless tobacco: Never Used  Substance and Sexual Activity  . Alcohol use: No  . Drug use: No  . Sexual activity: Not on file

## 2019-06-30 ENCOUNTER — Encounter: Payer: Self-pay | Admitting: Family Medicine

## 2019-06-30 ENCOUNTER — Ambulatory Visit (INDEPENDENT_AMBULATORY_CARE_PROVIDER_SITE_OTHER): Payer: Medicare Other | Admitting: Family Medicine

## 2019-06-30 DIAGNOSIS — M25571 Pain in right ankle and joints of right foot: Secondary | ICD-10-CM

## 2019-06-30 NOTE — Progress Notes (Signed)
Office Visit Note   Patient: [name] [name]           Date of Birth: 1976-07-16           MRN: [id number] Visit Date: 06/30/2019 Requested by: Angelica Ran, MD [address] [place],  [place] [address] PCP: Angelica Ran, MD  Subjective: Chief Complaint  Patient presents with  . Right Ankle - Pain    Continues to have pain in the medial aspect of the ankle, with swelling in the foot. Has not tried PT yet, because of "a huge pain flare" after the last ov. FWB in short fracture boot.    HPI: She is about 6 months status post right ankle syndesmosis and lateral ligament sprain.  Still having quite a bit of pain anteriorly.  She is full weightbearing in her fracture boot, unable to wean out of the fracture boot.              ROS: No fevers or chills.  All other systems were reviewed and are negative.  Objective: Vital Signs: There were no vitals taken for this visit.  Physical Exam:  General:  Alert and oriented, in no acute distress. Pulm:  Breathing unlabored. Psy:  Normal mood, congruent affect. Skin: No erythema or bruising. Right ankle: No tenderness seems to be along the tibialis anterior tendon.  She is able to dorsiflex against resistance with intact tendon function.  No pain with extension of the great toe against resistance.  Imaging: None today.  Assessment & Plan: 1.  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 -Discussion with patient, she is extremely reluctant about iontophoresis due to previous reactions to steroids in general.  We will try laser therapy at [facility].  She will contact me if symptoms are not improving.     Procedures: No procedures performed  No notes on file     PMFS History: Patient Active Problem List   Diagnosis Date Noted  . Cellulitis of hand 12/29/2018  . Erythrocytosis 12/29/2018  . Hyperlipidemia 12/29/2018  . Panic attacks  12/29/2018  . Recurrent depression ([place]) 12/29/2018  . Sleep disorder 12/29/2018  . Urine incontinence 12/29/2018  . Vitamin D deficiency 12/29/2018  . Tobacco abuse 07/21/2018  . Trigger finger of right thumb 05/12/2017  . Lateral epicondylitis of both elbows 02/17/2017  . Pain 02/17/2017  . 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 Essential hypertension 12/09/2015  . Anxiety 12/06/2015  . Complex regional pain syndrome I of upper limb 03/29/2014  . Lumbar back pain with radiculopathy affecting left lower extremity 03/19/2014  . Baker's cyst of knee 05/18/2013  . Carpal tunnel syndrome of right wrist 05/18/2013  . Left ankle pain 02/02/2013  . Constipation, chronic 07/29/2012  . Post-surgical hypothyroidism 04/28/2012   Past Medical History:  Diagnosis Date  . Anxiety   . Arthritis    ankle, left wrist, back  . Diverticulitis   . History of removal of ovarian cyst    89  . History of skull fracture age 53   no residual effects  . Hyperlipidemia   . Hypothyroidism   . Neuromuscular disorder ([place])   . Stenosing tenosynovitis of finger 10/2012   right middle finger  . Thyroid disease   . Wears partial dentures    upper    History reviewed. No pertinent family history.  Past Surgical History:  Procedure Laterality Date  . ABDOMINAL ADHESION SURGERY    .  ABDOMINAL HYSTERECTOMY  2001   complete  . ABDOMINAL HYSTERECTOMY    . APPENDECTOMY    . CARPAL TUNNEL RELEASE Right 06/22/2017   Procedure: RIGHT CARPAL TUNNEL RELEASE;  Surgeon: Cindee SaltKuzma, Gary, MD;  Location: [facility];  Service: Orthopedics;  Laterality: Right;  AXILLARY BLOCK  . CHOLECYSTECTOMY    . gum cyst removal    . LAPAROSCOPY    . left ankle ligament repair    . MOUTH SURGERY     removal of cysts - x 4 surgeries  . PARTIAL HYSTERECTOMY  1989  . THYROIDECTOMY    . TOTAL THYROIDECTOMY    . TRIGGER FINGER RELEASE Right 11/18/2012   Procedure: RELEASE TRIGGER FINGER/A-1 PULLEY;  Surgeon: Nicki ReaperGary R Kuzma, MD;   Location: [facility];  Service: Orthopedics;  Laterality: Right;  . TRIGGER FINGER RELEASE    . TRIGGER FINGER RELEASE Right 06/22/2017   Procedure: RELEASE TRIGGER FINGER/A-1 PULLEY RIGHT THUMB;  Surgeon: Cindee SaltKuzma, Gary, MD;  Location: [facility];  Service: Orthopedics;  Laterality: Right;  . WRIST ARTHROSCOPY W/ TRIANGULAR FIBROCARTILAGE REPAIR  03/25/2011   and debridement LT tear   Social History   Occupational History  . Not on file  Tobacco Use  . Smoking status: Current Every Day Smoker    Packs/day: 1.00    Years: 20.00    Pack years: 20.00    Types: Cigarettes  . Smokeless tobacco: Never Used  Substance and Sexual Activity  . Alcohol use: No  . Drug use: No  . Sexual activity: Not on file

## 2019-12-13 DIAGNOSIS — N183 Chronic kidney disease, stage 3 unspecified: Secondary | ICD-10-CM | POA: Insufficient documentation

## 2020-01-02 ENCOUNTER — Other Ambulatory Visit: Payer: Self-pay

## 2020-01-02 ENCOUNTER — Encounter: Payer: Self-pay | Admitting: Family Medicine

## 2020-01-02 ENCOUNTER — Ambulatory Visit (INDEPENDENT_AMBULATORY_CARE_PROVIDER_SITE_OTHER): Payer: Medicare Other | Admitting: Family Medicine

## 2020-01-02 DIAGNOSIS — M25571 Pain in right ankle and joints of right foot: Secondary | ICD-10-CM | POA: Diagnosis not present

## 2020-01-02 NOTE — Progress Notes (Signed)
Office Visit Note   Patient: [name] [name]           Date of Birth: [birth year]           MRN: [id number] Visit Date: 01/02/2020 Requested by: Garnetta Buddy, MD [address] [place],  [place] [address] PCP: Garnetta Buddy, MD  Subjective: Chief Complaint  Patient presents with  . Right Ankle - Pain    Been out of cam boot for a couple months. Pain and swelling started 3 days ago. Pain anterior ankle.    HPI: She is here with recurrent right ankle pain.  1 year ago she injured her syndesmosis and also had some tibialis anterior tendinopathy on MRI scan.  Because of her RSD, her recovery was lengthy.  She eventually weaned out of her fracture boot and for a couple months she was doing pretty well but then she recently had to take an urgent trip to [place] and after driving many hours, she started feeling pain in the anterior ankle.  It was swollen and slightly bruised.  She used her fracture boot for a day and that helped.  The pain is a little bit better but she is still having quite a bit of discomfort.              ROS:   All other systems were reviewed and are negative.  Objective: Vital Signs: There were no vitals taken for this visit.  Physical Exam:  General:  Alert and oriented, in no acute distress. Pulm:  Breathing unlabored. Psy:  Normal mood, congruent affect. Skin: No erythema, no temperature change, no sweating. Right ankle: She has no joint effusion.  She can dorsiflex against resistance and extend her great toe against resistance with good strength and intact tendon function.  It is painful to dorsiflex the foot and she is tender to direct palpation over the tibialis anterior tendon.  This seems to reproduce her pain.  Imaging: None today  Assessment & Plan: 1.  Right ankle pain probably due to tibialis anterior tendinopathy -We will refer her to [facility] for possible laser therapy.  Could contemplate nitroglycerin patch therapy  if symptoms persist, although I am not sure how that would affect RSD. -If she gets acutely worse and is unable to dorsiflex her ankle, she will come in quickly for evaluation.     Procedures: No procedures performed  No notes on file     PMFS History: Patient Active Problem List   Diagnosis Date Noted  . Chronic kidney disease (CKD), stage III (moderate) 12/13/2019  . Cellulitis of hand 12/29/2018  . Erythrocytosis 12/29/2018  . Hyperlipidemia 12/29/2018  . Panic attacks 12/29/2018  . Recurrent depression (HCC) 12/29/2018  . Sleep disorder 12/29/2018  . Urine incontinence 12/29/2018  . Vitamin D deficiency 12/29/2018  . Tobacco abuse 07/21/2018  . Trigger finger of right thumb 05/12/2017  . Lateral epicondylitis of both elbows 02/17/2017  . Pain 02/17/2017  . 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 Essential hypertension 12/09/2015  . Anxiety 12/06/2015  . Complex regional pain syndrome I of upper limb 03/29/2014  . Lumbar back pain with radiculopathy affecting left lower extremity 03/19/2014  . Baker's cyst of knee 05/18/2013  . Carpal tunnel syndrome of right wrist 05/18/2013  . Left ankle pain 02/02/2013  . Constipation, chronic 07/29/2012  . Post-surgical hypothyroidism 04/28/2012   Past Medical History:  Diagnosis Date  . Anxiety   . Arthritis    ankle, left wrist, back  .  Diverticulitis   . History of removal of ovarian cyst    89  . History of skull fracture age 1   no residual effects  . Hyperlipidemia   . Hypothyroidism   . Neuromuscular disorder ([place])   . Stenosing tenosynovitis of finger 10/2012   right middle finger  . Thyroid disease   . Wears partial dentures    upper    History reviewed. No pertinent family history.  Past Surgical History:  Procedure Laterality Date  . ABDOMINAL ADHESION SURGERY    . ABDOMINAL HYSTERECTOMY  2001   complete  . ABDOMINAL HYSTERECTOMY    . APPENDECTOMY    . CARPAL TUNNEL RELEASE Right 06/22/2017   Procedure: RIGHT  CARPAL TUNNEL RELEASE;  Surgeon: Daryll Brod, MD;  Location: [facility];  Service: Orthopedics;  Laterality: Right;  AXILLARY BLOCK  . CHOLECYSTECTOMY    . gum cyst removal    . LAPAROSCOPY    . left ankle ligament repair    . MOUTH SURGERY     removal of cysts - x 4 surgeries  . PARTIAL HYSTERECTOMY  1989  . THYROIDECTOMY    . TOTAL THYROIDECTOMY    . TRIGGER FINGER RELEASE Right 11/18/2012   Procedure: RELEASE TRIGGER FINGER/A-1 PULLEY;  Surgeon: Wynonia Sours, MD;  Location: [facility];  Service: Orthopedics;  Laterality: Right;  . TRIGGER FINGER RELEASE    . TRIGGER FINGER RELEASE Right 06/22/2017   Procedure: RELEASE TRIGGER FINGER/A-1 PULLEY RIGHT THUMB;  Surgeon: Daryll Brod, MD;  Location: [facility];  Service: Orthopedics;  Laterality: Right;  . WRIST ARTHROSCOPY W/ TRIANGULAR FIBROCARTILAGE REPAIR  03/25/2011   and debridement LT tear   Social History   Occupational History  . Not on file  Tobacco Use  . Smoking status: Current Every Day Smoker    Packs/day: 1.00    Years: 20.00    Pack years: 20.00    Types: Cigarettes  . Smokeless tobacco: Never Used  Substance and Sexual Activity  . Alcohol use: No  . Drug use: No  . Sexual activity: Not on file

## 2020-01-25 ENCOUNTER — Other Ambulatory Visit: Payer: Self-pay | Admitting: Nephrology

## 2020-01-25 DIAGNOSIS — R7989 Other specified abnormal findings of blood chemistry: Secondary | ICD-10-CM

## 2020-01-25 DIAGNOSIS — I10 Essential (primary) hypertension: Secondary | ICD-10-CM

## 2020-01-31 ENCOUNTER — Ambulatory Visit
Admission: RE | Admit: 2020-01-31 | Discharge: 2020-01-31 | Disposition: A | Discharge status: Home / Self Care | Payer: Medicare Other | Source: Ambulatory Visit | Attending: Nephrology | Admitting: Nephrology

## 2020-01-31 DIAGNOSIS — R7989 Other specified abnormal findings of blood chemistry: Secondary | ICD-10-CM

## 2020-01-31 DIAGNOSIS — I10 Essential (primary) hypertension: Secondary | ICD-10-CM

## 2020-11-18 ENCOUNTER — Ambulatory Visit: Payer: Medicare Other | Admitting: Family Medicine

## 2020-11-25 ENCOUNTER — Other Ambulatory Visit: Payer: Self-pay

## 2020-11-25 ENCOUNTER — Ambulatory Visit (INDEPENDENT_AMBULATORY_CARE_PROVIDER_SITE_OTHER): Payer: Medicare Other | Admitting: Family Medicine

## 2020-11-25 DIAGNOSIS — M79671 Pain in right foot: Secondary | ICD-10-CM | POA: Diagnosis not present

## 2020-11-25 NOTE — Progress Notes (Signed)
Office Visit Note   Patient: [name] [name]           Date of Birth: 08-14-1976           MRN: [id number] Visit Date: 11/25/2020 Requested by: [name] Buddy, MD [address] [place],  [place] [address] PCP: [name] Buddy, MD  Subjective: Chief Complaint  Patient presents with  . Right Foot - Pain    Slid on some mud in her yard on 11/12/20, and landed on her buttock, but with "foot turned up under" her.  Pain in the ankle/foot since then. Hurts to flex the foot. Much of the swelling has gone down. Has been in her short cam boot since the fall.   . Right Ankle - Pain    HPI: She is here with right ankle pain.  About a week and a half ago she slipped on mud, fell backward and twisted her ankle.  She has chronic problems with this leg due to RSD.  I saw her a year ago and she had pretty much recovered from her injury, was back to her baseline before falling recently.  There was a lot of bruising and swelling initially but that seems to have improved.  She is able to bear weight now and does not have pain unless she is weightbearing.               ROS:   All other systems were reviewed and are negative.  Objective: Vital Signs: There were no vitals taken for this visit.  Physical Exam:  General:  Alert and oriented, in no acute distress. Pulm:  Breathing unlabored. Psy:  Normal mood, congruent affect. Skin: There is residual bruising near the ATFL. Right ankle: No tenderness at the proximal fibula, negative syndesmosis squeeze.  There is no tenderness on the posterior aspect of the lateral malleolus but she does have tenderness over the ATFL.  There is pain but no laxity with anterior drawer, no laxity with talar tilt test.  Imaging: No results found.  Assessment & Plan: 1.  Right ankle lateral sprain -Continue in fracture boot weightbearing as tolerated.  Range of motion exercises to prevent stiffness. -She can wean from the boot over the next couple  weeks as pain permits.  She does not think he would tolerate Thera-Band exercises, but she could do 1 foot balancing for strengthening.     Procedures: No procedures performed        PMFS History: Patient Active Problem List   Diagnosis Date Noted  . Chronic kidney disease (CKD), stage III (moderate) (HCC) 12/13/2019  . Cellulitis of hand 12/29/2018  . Erythrocytosis 12/29/2018  . Hyperlipidemia 12/29/2018  . Panic attacks 12/29/2018  . Recurrent depression (HCC) 12/29/2018  . Sleep disorder 12/29/2018  . Urine incontinence 12/29/2018  . Vitamin D deficiency 12/29/2018  . Tobacco abuse 07/21/2018  . Trigger finger of right thumb 05/12/2017  . Lateral epicondylitis of both elbows 02/17/2017  . Pain 02/17/2017  . 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 Essential hypertension 12/09/2015  . Anxiety 12/06/2015  . Complex regional pain syndrome I of upper limb 03/29/2014  . Lumbar back pain with radiculopathy affecting left lower extremity 03/19/2014  . Baker's cyst of knee 05/18/2013  . Carpal tunnel syndrome of right wrist 05/18/2013  . Left ankle pain 02/02/2013  . Constipation, chronic 07/29/2012  . Post-surgical hypothyroidism 04/28/2012   Past Medical History:  Diagnosis Date  . Anxiety   . Arthritis  ankle, left wrist, back  . Diverticulitis   . History of removal of ovarian cyst    89  . History of skull fracture age 89   no residual effects  . Hyperlipidemia   . Hypothyroidism   . Neuromuscular disorder (HCC)   . Stenosing tenosynovitis of finger 10/2012   right middle finger  . Thyroid disease   . Wears partial dentures    upper    No family history on file.  Past Surgical History:  Procedure Laterality Date  . ABDOMINAL ADHESION SURGERY    . ABDOMINAL HYSTERECTOMY  2001   complete  . ABDOMINAL HYSTERECTOMY    . APPENDECTOMY    . CARPAL TUNNEL RELEASE Right 06/22/2017   Procedure: RIGHT CARPAL TUNNEL RELEASE;  Surgeon: Cindee Salt, MD;  Location: [facility];  Service: Orthopedics;  Laterality: Right;  AXILLARY BLOCK  . CHOLECYSTECTOMY    . gum cyst removal    . LAPAROSCOPY    . left ankle ligament repair    . MOUTH SURGERY     removal of cysts - x 4 surgeries  . PARTIAL HYSTERECTOMY  1989  . THYROIDECTOMY    . TOTAL THYROIDECTOMY    . TRIGGER FINGER RELEASE Right 11/18/2012   Procedure: RELEASE TRIGGER FINGER/A-1 PULLEY;  Surgeon: Nicki Reaper, MD;  Location: [facility];  Service: Orthopedics;  Laterality: Right;  . TRIGGER FINGER RELEASE    . TRIGGER FINGER RELEASE Right 06/22/2017   Procedure: RELEASE TRIGGER FINGER/A-1 PULLEY RIGHT THUMB;  Surgeon: Cindee Salt, MD;  Location: [facility];  Service: Orthopedics;  Laterality: Right;  . WRIST ARTHROSCOPY W/ TRIANGULAR FIBROCARTILAGE REPAIR  03/25/2011   and debridement LT tear   Social History   Occupational History  . Not on file  Tobacco Use  . Smoking status: Current Every Day Smoker    Packs/day: 1.00    Years: 20.00    Pack years: 20.00    Types: Cigarettes  . Smokeless tobacco: Never Used  Vaping Use  . Vaping Use: Never used  Substance and Sexual Activity  . Alcohol use: No  . Drug use: No  . Sexual activity: Not on file

## 2021-04-25 ENCOUNTER — Other Ambulatory Visit: Payer: Self-pay

## 2021-04-25 ENCOUNTER — Encounter (HOSPITAL_BASED_OUTPATIENT_CLINIC_OR_DEPARTMENT_OTHER): Payer: Self-pay

## 2021-04-25 ENCOUNTER — Emergency Department (HOSPITAL_BASED_OUTPATIENT_CLINIC_OR_DEPARTMENT_OTHER)
Admission: EM | Admit: 2021-04-25 | Discharge: 2021-04-25 | Disposition: A | Discharge status: Home / Self Care | Payer: Medicare Other | Attending: Emergency Medicine | Admitting: Emergency Medicine

## 2021-04-25 DIAGNOSIS — N183 Chronic kidney disease, stage 3 unspecified: Secondary | ICD-10-CM | POA: Insufficient documentation

## 2021-04-25 DIAGNOSIS — I129 Hypertensive chronic kidney disease with stage 1 through stage 4 chronic kidney disease, or unspecified chronic kidney disease: Secondary | ICD-10-CM | POA: Diagnosis not present

## 2021-04-25 DIAGNOSIS — E890 Postprocedural hypothyroidism: Secondary | ICD-10-CM | POA: Insufficient documentation

## 2021-04-25 DIAGNOSIS — T370X5A Adverse effect of sulfonamides, initial encounter: Secondary | ICD-10-CM | POA: Insufficient documentation

## 2021-04-25 DIAGNOSIS — R600 Localized edema: Secondary | ICD-10-CM | POA: Insufficient documentation

## 2021-04-25 DIAGNOSIS — Z79899 Other long term (current) drug therapy: Secondary | ICD-10-CM | POA: Diagnosis not present

## 2021-04-25 DIAGNOSIS — F17210 Nicotine dependence, cigarettes, uncomplicated: Secondary | ICD-10-CM | POA: Diagnosis not present

## 2021-04-25 DIAGNOSIS — M7989 Other specified soft tissue disorders: Secondary | ICD-10-CM | POA: Diagnosis present

## 2021-04-25 DIAGNOSIS — T50905A Adverse effect of unspecified drugs, medicaments and biological substances, initial encounter: Secondary | ICD-10-CM

## 2021-04-25 LAB — BASIC METABOLIC PANEL
Anion gap: 9 (ref 5–15)
BUN: 11 mg/dL (ref 6–20)
CO2: 28 mmol/L (ref 22–32)
Calcium: 8.6 mg/dL — ABNORMAL LOW (ref 8.9–10.3)
Chloride: 101 mmol/L (ref 98–111)
Creatinine, Ser: 1.34 mg/dL — ABNORMAL HIGH (ref 0.44–1.00)
GFR, Estimated: 49 mL/min — ABNORMAL LOW (ref >60)
Glucose, Bld: 109 mg/dL — ABNORMAL HIGH (ref 70–99)
Potassium: 4.3 mmol/L (ref 3.5–5.1)
Sodium: 138 mmol/L (ref 135–145)

## 2021-04-25 MED ORDER — DOXYCYCLINE HYCLATE 100 MG PO CAPS: 100.0000 mg | ORAL_CAPSULE | Freq: Two times a day (BID) | ORAL | 0 refills | Status: DC

## 2021-04-25 NOTE — Discharge Instructions (Addendum)
Stop taking the Bactrim.  Start the doxycycline.  Follow-up with your doctor.

## 2021-04-25 NOTE — ED Triage Notes (Signed)
Patient here POV from Home with Extremity Swelling.  Patient states she was diagnosed with MRSA yesterday and began taking Bactrim today.   Patient states she began to notice swelling and discoloration to Feet and Ankles as well as Hands.  No CP, No SOB, No Angioedema noted during Triage. NAD.   Patient did take 81 mg ASA and 50 mg Benadryl PTA.

## 2021-04-25 NOTE — ED Provider Notes (Signed)
[facility] EMERGENCY DEPT Provider Note   CSN: [id number] Arrival date & time: 04/25/21  0005     History Chief Complaint  Patient presents with   Extremity Swelling    [name] [name] is a 45 y.o. female.  HPI Patient presents with mild swelling in her hands and feet.  Came on after starting Bactrim for a right-sided breast abscess.  This had Bactrim previously but been a while ago.  States mild swelling of both legs and hands.  No difficulty breathing.  No rash.  Also found a tick on her left upper arm.  States that would have attached today.  States she has some kidney problems and is seeing a nephrologist but does not know her baseline creatinine.    Past Medical History:  Diagnosis Date   Anxiety    Arthritis    ankle, left wrist, back   Diverticulitis    History of removal of ovarian cyst    89   History of skull fracture age 45   no residual effects   Hyperlipidemia    Hypothyroidism    Neuromuscular disorder (HCC)    Stenosing tenosynovitis of finger 10/2012   right middle finger   Thyroid disease    Wears partial dentures    upper    Patient Active Problem List   Diagnosis Date Noted   Chronic kidney disease (CKD), stage III (moderate) (HCC) 12/13/2019   Cellulitis of hand 12/29/2018   Erythrocytosis 12/29/2018   Hyperlipidemia 12/29/2018   Panic attacks 12/29/2018   Recurrent depression (HCC) 12/29/2018   Sleep disorder 12/29/2018   Urine incontinence 12/29/2018   Vitamin D deficiency 12/29/2018   Tobacco abuse 07/21/2018   Trigger finger of right thumb 05/12/2017   Lateral epicondylitis of both elbows 02/17/2017   Pain 02/17/2017   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   Essential hypertension 12/09/2015   Anxiety 12/06/2015   Complex regional pain syndrome I of upper limb 03/29/2014   Lumbar back pain with radiculopathy affecting left lower extremity 03/19/2014   Baker's cyst of knee 05/18/2013   Carpal tunnel syndrome of right wrist  05/18/2013   Left ankle pain 02/02/2013   Constipation, chronic 07/29/2012   Post-surgical hypothyroidism 04/28/2012    Past Surgical History:  Procedure Laterality Date   ABDOMINAL ADHESION SURGERY     ABDOMINAL HYSTERECTOMY  2001   complete   ABDOMINAL HYSTERECTOMY     APPENDECTOMY     CARPAL TUNNEL RELEASE Right 06/22/2017   Procedure: RIGHT CARPAL TUNNEL RELEASE;  Surgeon: Cindee Salt, MD;  Location: [facility];  Service: Orthopedics;  Laterality: Right;  AXILLARY BLOCK   CHOLECYSTECTOMY     gum cyst removal     LAPAROSCOPY     left ankle ligament repair     MOUTH SURGERY     removal of cysts - x 4 surgeries   PARTIAL HYSTERECTOMY  1989   THYROIDECTOMY     TOTAL THYROIDECTOMY     TRIGGER FINGER RELEASE Right 11/18/2012   Procedure: RELEASE TRIGGER FINGER/A-1 PULLEY;  Surgeon: Nicki Reaper, MD;  Location: [facility];  Service: Orthopedics;  Laterality: Right;   TRIGGER FINGER RELEASE     TRIGGER FINGER RELEASE Right 06/22/2017   Procedure: RELEASE TRIGGER FINGER/A-1 PULLEY RIGHT THUMB;  Surgeon: Cindee Salt, MD;  Location: [facility];  Service: Orthopedics;  Laterality: Right;   WRIST ARTHROSCOPY W/ TRIANGULAR FIBROCARTILAGE REPAIR  03/25/2011   and debridement LT tear     OB  History     Gravida  0   Para  0   Term  0   Preterm  0   AB  0   Living         SAB  0   IAB  0   Ectopic  0   Multiple      Live Births              No family history on file.  Social History   Tobacco Use   Smoking status: Every Day    Packs/day: 1.00    Years: 20.00    Pack years: 20.00    Types: Cigarettes   Smokeless tobacco: Never  Vaping Use   Vaping Use: Never used  Substance Use Topics   Alcohol use: No   Drug use: No    Home Medications Prior to Admission medications   Medication Sig Start Date End Date Taking? Authorizing Provider  doxycycline (VIBRAMYCIN) 100 MG capsule Take 1 capsule (100 mg total) by  mouth 2 (two) times daily. 04/25/21  Yes Benjiman Core, MD  atorvastatin (LIPITOR) 40 MG tablet Take 40 mg by mouth daily.    [provider]  carisoprodol (SOMA) 350 MG tablet carisoprodol 350 mg tablet    [provider]  cyclobenzaprine (FLEXERIL) 10 MG tablet Take 10 mg by mouth every 12 (twelve) hours as needed. 09/13/18   [provider]  escitalopram (LEXAPRO) 10 MG tablet Take 10 mg by mouth daily.    [provider]  folic acid (FOLVITE) 400 MCG tablet Take by mouth.    [provider]  hydrocortisone 2.5 % cream APPLY ONE APPLICATION TOPICALLY 3 TIMES DAILY 07/27/18   [provider]  levothyroxine (SYNTHROID, LEVOTHROID) 88 MCG tablet Take 88 mcg by mouth daily. 11/23/18   [provider]  lidocaine (XYLOCAINE) 2 % jelly Apply topically daily as needed. 11/20/20   [provider]  losartan (COZAAR) 100 MG tablet Take by mouth. 12/14/19   [provider]  metoprolol tartrate (LOPRESSOR) 50 MG tablet Take 50 mg by mouth 2 (two) times daily. 11/23/18   [provider]  oxyCODONE-acetaminophen (PERCOCET) 10-325 MG tablet Take 1 tablet by mouth every 4 (four) hours as needed for pain.    [provider]  polyethylene glycol powder (GLYCOLAX/MIRALAX) powder Take by mouth.    [provider]  promethazine (PHENERGAN) 12.5 MG tablet promethazine 12.5 mg tablet 03/14/18   [provider]  Vitamin D, Ergocalciferol, (DRISDOL) 1.25 MG (50000 UT) CAPS capsule 50,000 Units once a week. 01/25/18   [provider]    Allergies    Hydrochlorothiazide, Cortisone, and Pravastatin  Review of Systems   Review of Systems  Constitutional:  Negative for appetite change.  Respiratory:  Negative for shortness of breath.   Cardiovascular:  Positive for leg swelling. Negative for chest pain.  Gastrointestinal:  Negative for abdominal distention.  Genitourinary:  Negative for flank pain.   Skin:  Positive for wound. Negative for rash.  Neurological:  Negative for weakness.  Psychiatric/Behavioral:  Negative for confusion.    Physical Exam Updated Vital Signs BP 129/74 (BP Location: Right Arm)   Pulse 79   Temp 98 F (36.7 C) (Oral)   Resp 19   Ht 5' (1.524 m)   Wt 68 kg   SpO2 97%   BMI 29.29 kg/m   Physical Exam Vitals and nursing note reviewed.  HENT:     Head: Normocephalic.  Eyes:  Pupils: Pupils are equal, round, and reactive to light.  Cardiovascular:     Rate and Rhythm: Regular rhythm.  Pulmonary:     Breath sounds: No wheezing or rhonchi.  Abdominal:     Tenderness: There is no abdominal tenderness.  Musculoskeletal:     Cervical back: Neck supple.     Comments: Mild edema bilateral hands and bilateral foot and ankles.  No erythema.  No rash.  Skin:    Comments: On inferior aspect of right breast there is a small fluctuant area.  No active draining although previously reportedly had purulent drainage.  Neurological:     Mental Status: She is alert.    ED Results / Procedures / Treatments   Labs (all labs ordered are listed, but only abnormal results are displayed) Labs Reviewed  BASIC METABOLIC PANEL - Abnormal; Notable for the following components:      Result Value   Glucose, Bld 109 (*)    Creatinine, Ser 1.34 (*)    Calcium 8.6 (*)    GFR, Estimated 49 (*)    All other components within normal limits    EKG None  Radiology No results found.  Procedures Procedures   Medications Ordered in ED Medications - No data to display  ED Course  I have reviewed the triage vital signs and the nursing notes.  Pertinent labs & imaging results that were available during my care of the patient were reviewed by me and considered in my medical decision making (see chart for details).    MDM Rules/Calculators/A&P                          Patient with potential reaction to Bactrim.  Some swelling in hands and feet.  Creatinine  mildly elevated but states she has some kidney problems.  Unsure of baseline but doubt severe acute change.  Is on Bactrim for possible breast abscess.  Will treat with doxycycline and stop the Bactrim.  Outpatient follow-up as needed.  Also had tick on left upper arm that was removed.  No surrounding erythema.  Doxycycline potentially would cover tickborne diseases but with short dwelling time do not think we would need treatment specifically for that. Final Clinical Impression(s) / ED Diagnoses Final diagnoses:  Adverse effect of drug, initial encounter    Rx / DC Orders ED Discharge Orders          Ordered    doxycycline (VIBRAMYCIN) 100 MG capsule  2 times daily        04/25/21 0305             Benjiman Core, MD 04/25/21 [phone]

## 2021-04-25 NOTE — ED Notes (Signed)
Upon entering Examination Room, the patient stated there was also a tick on her Upper Arm.

## 2022-02-09 ENCOUNTER — Encounter (HOSPITAL_COMMUNITY): Payer: Self-pay

## 2022-02-09 ENCOUNTER — Telehealth (HOSPITAL_COMMUNITY): Payer: Self-pay

## 2022-02-09 NOTE — Telephone Encounter (Signed)
Outside/paper referral received by Dr. Maisie Fus from [place]. Will fax over Physician order and request further documents. Insurance benefits and eligibility to be determined.  ? ?Attempted to call patient in regards to Cardiac Rehab - LM on VM  ? ?Mailed letter ?

## 2022-02-23 ENCOUNTER — Telehealth (HOSPITAL_COMMUNITY): Payer: Self-pay

## 2022-02-23 NOTE — Telephone Encounter (Signed)
Pt returned CR phone call and stated she is not able to participate due to her CPBS. ? ?Closed referral ?

## 2022-02-26 IMAGING — US US RENAL ARTERY STENOSIS
1 series · 13 of 25 positions shown · non-contrast
Comparison: None.

CLINICAL DATA: 43-year-old female with malignant hypertension and
increasing creatinine

EXAM:
RENAL/URINARY TRACT ULTRASOUND
RENAL DUPLEX DOPPLER ULTRASOUND

[Series 1: us renal artery stenosis · 0.22mm/px · 13 of 89 slices shown]
[im 1/89]
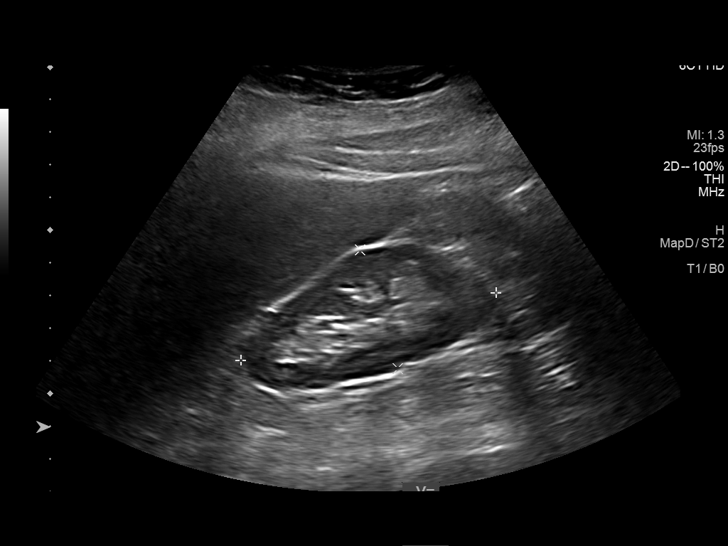
[im 8/89]
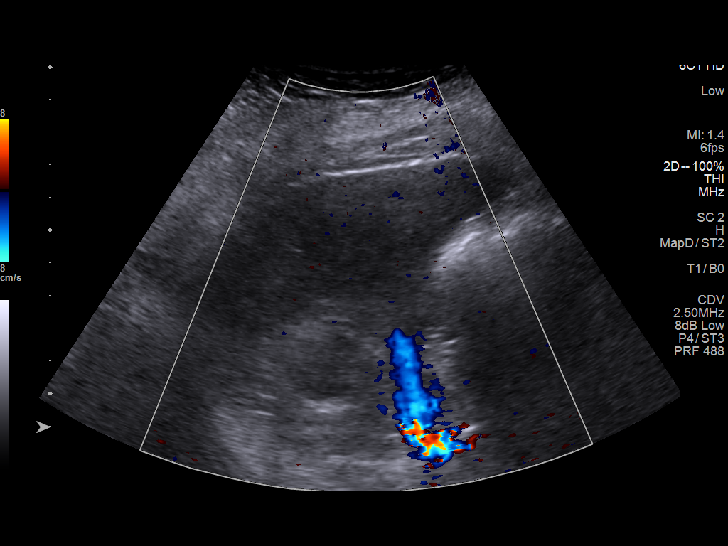
[im 15/89]
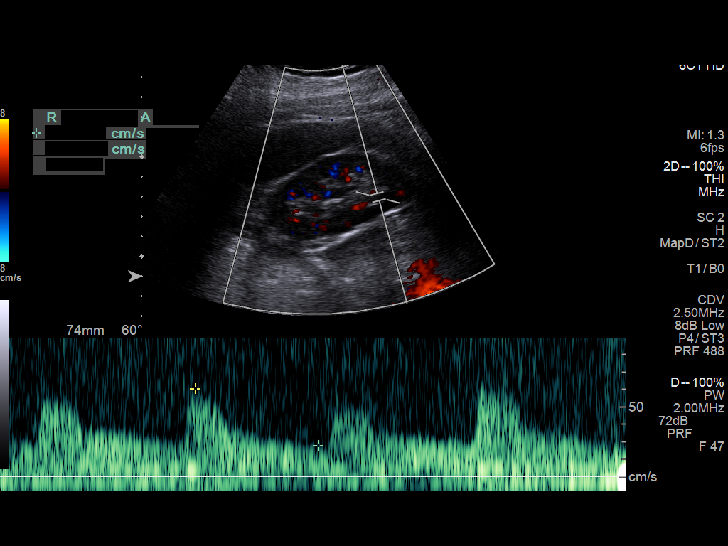
[im 23/89]
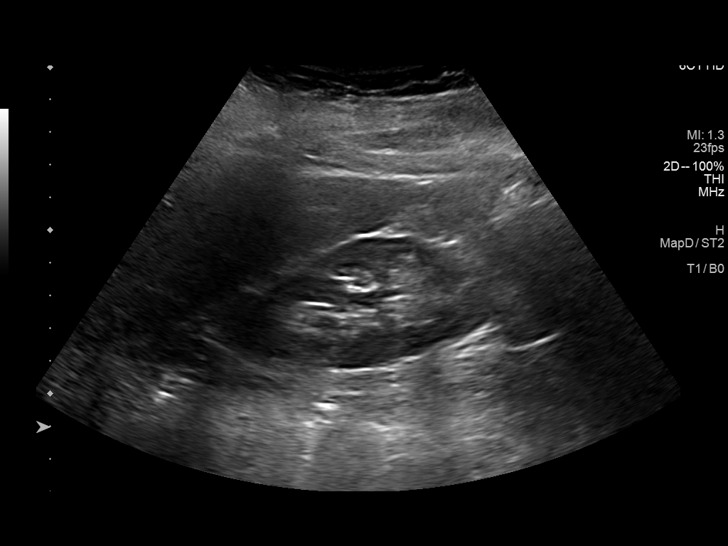
[im 30/89]
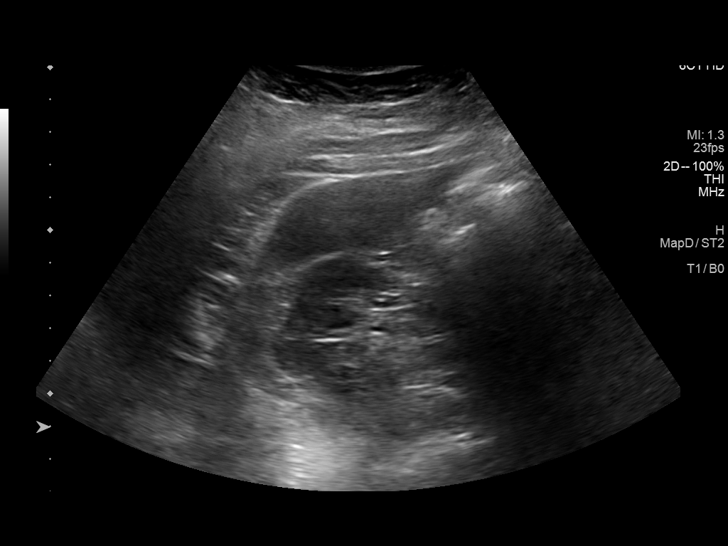
[im 37/89]
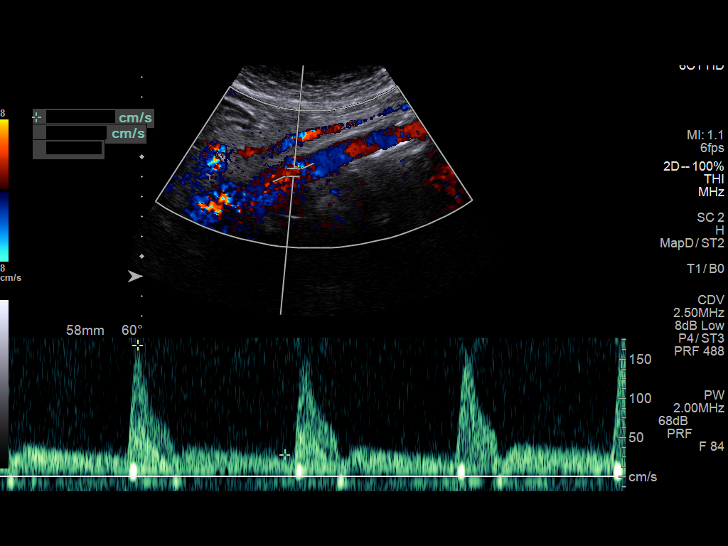
[im 45/89]
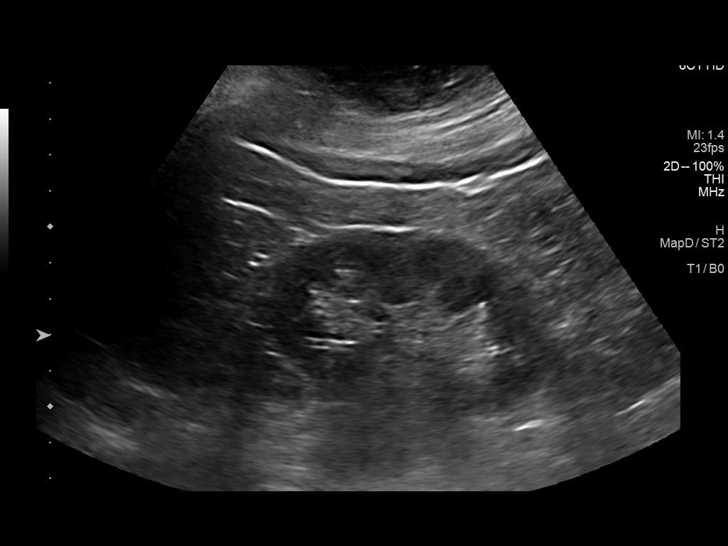
[im 52/89]
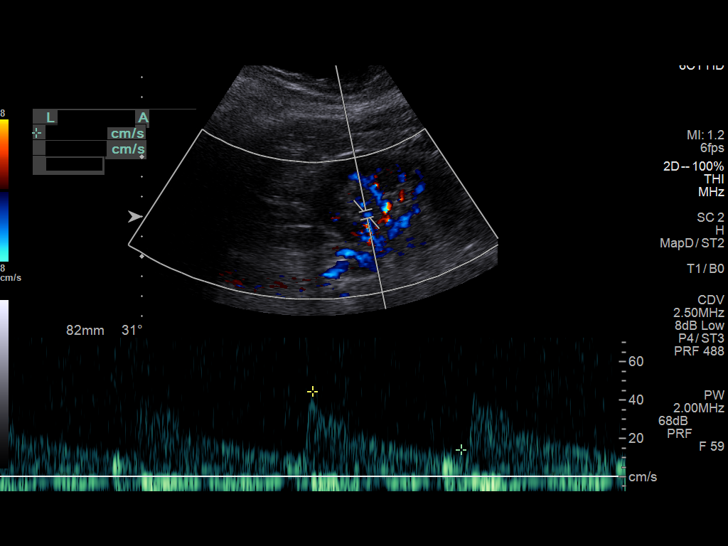
[im 59/89]
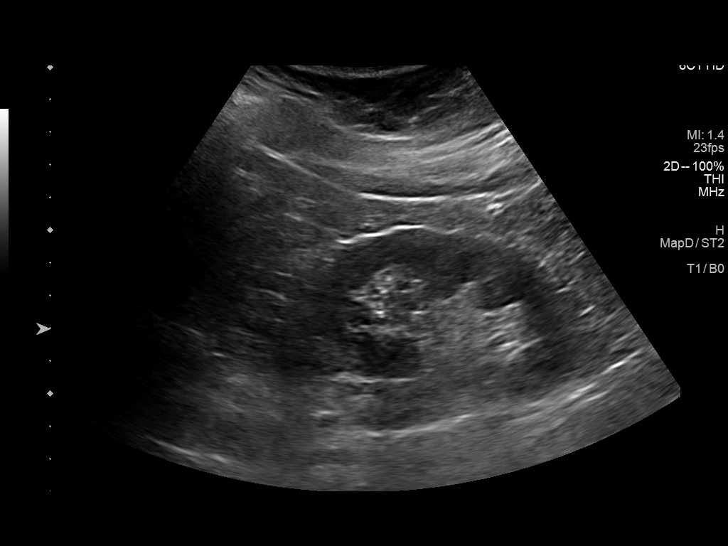
[im 67/89]
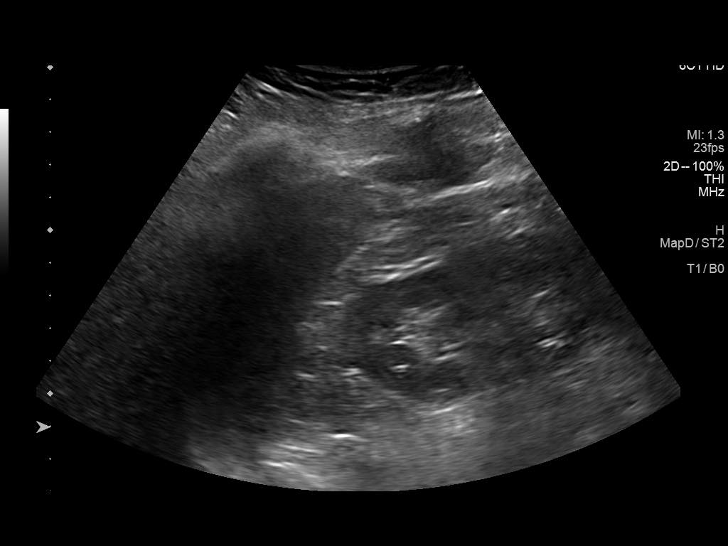
[im 74/89]
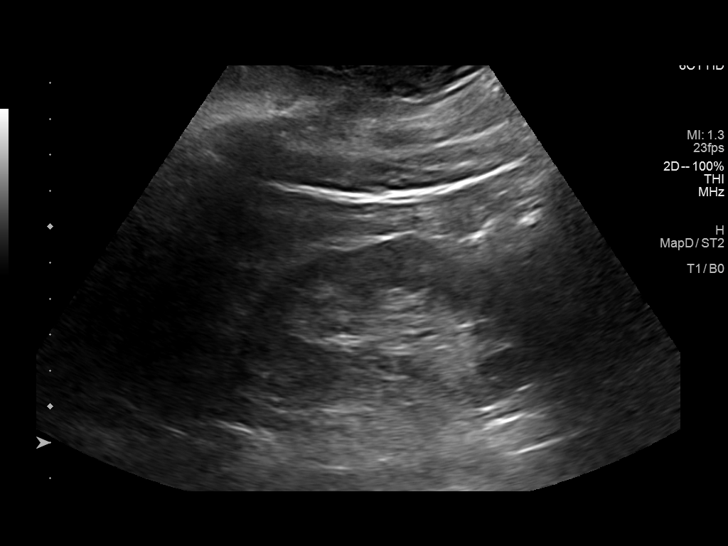
[im 81/89]
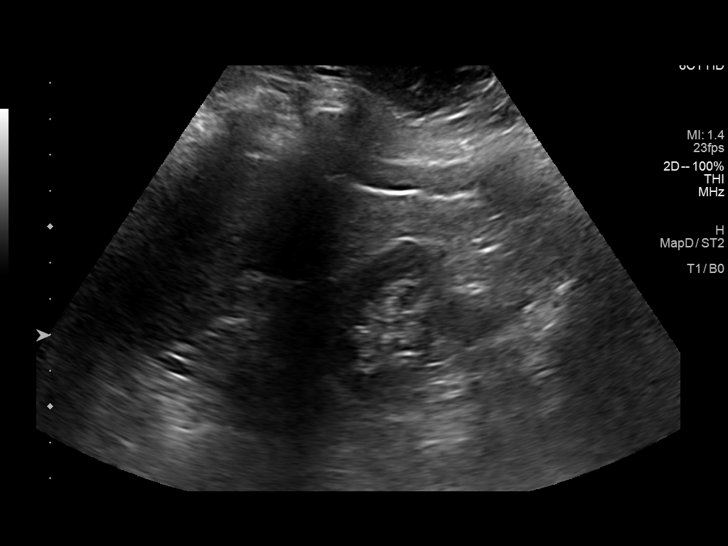
[im 89/89]
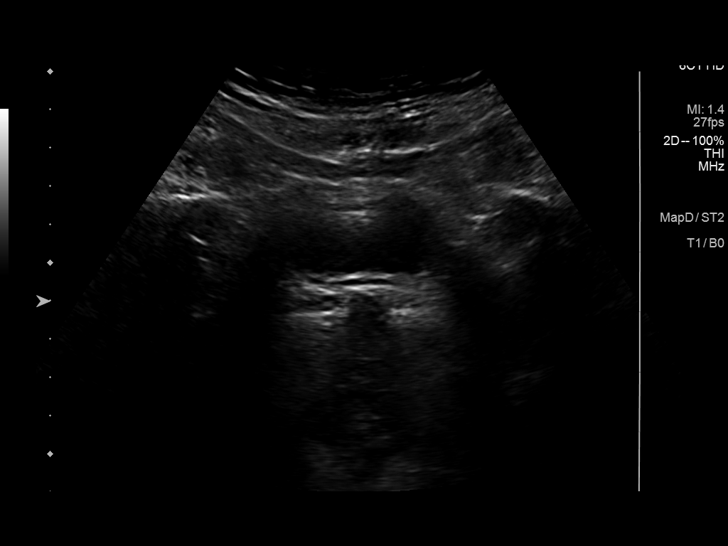

[13 of 25 positions shown; findings below may reference images not displayed]

FINDINGS: Right Kidney:

Length: 8.1 x 3.8 x 4.5 cm for a total volume of 73 mL. Echogenicity
within normal limits. No mass or hydronephrosis visualized.

Left Kidney:

Length: 8.3 x 4.1 x 4.4 cm for a total volume of 79 mL. Parenchymal
echogenicity is within normal limits. There is a 0.6 cm echogenic
focus with subtle posterior acoustic shadowing in the upper pole
likely representing a small stone.

Bladder:  Unremarkable given the degree of under distension.

RENAL DUPLEX ULTRASOUND

Right Renal Artery Velocities:

Origin:  177 cm/sec

Mid:  111 cm/sec

Hilum:  140 cm/sec

Interlobar:  58 cm/sec

Arcuate:  23 cm/sec

Left Renal Artery Velocities:

Origin:  120 cm/sec

Mid:  111 cm/sec

Hilum:  103 cm/sec

Interlobar:  48 cm/sec

Arcuate:  22 cm/sec

Aortic Velocity:  164 cm/sec

Right Renal-Aortic Ratios:

Origin:

Mid:

Hilum:

Interlobar:

Arcuate:

Left Renal-Aortic Ratios:

Origin:

Mid:

Hilum:

Interlobar:

Arcuate:
IMPRESSION: 1. No evidence of hemodynamically significant renal artery stenosis.
2. Small 6 mm left upper pole renal stone.
3. No evidence of hydronephrosis or renal mass.

## 2022-10-14 ENCOUNTER — Emergency Department (HOSPITAL_BASED_OUTPATIENT_CLINIC_OR_DEPARTMENT_OTHER): Payer: Medicare Other

## 2022-10-14 ENCOUNTER — Encounter (HOSPITAL_BASED_OUTPATIENT_CLINIC_OR_DEPARTMENT_OTHER): Payer: Self-pay

## 2022-10-14 ENCOUNTER — Other Ambulatory Visit: Payer: Self-pay

## 2022-10-14 ENCOUNTER — Emergency Department: Payer: Self-pay

## 2022-10-14 ENCOUNTER — Encounter (HOSPITAL_COMMUNITY)
Admission: EM | Disposition: A | Discharge status: Home / Self Care | Payer: Self-pay | Source: Home / Self Care | Attending: Cardiology

## 2022-10-14 ENCOUNTER — Inpatient Hospital Stay (HOSPITAL_COMMUNITY): Payer: Medicare Other

## 2022-10-14 ENCOUNTER — Inpatient Hospital Stay (HOSPITAL_BASED_OUTPATIENT_CLINIC_OR_DEPARTMENT_OTHER)
Admission: EM | Admit: 2022-10-14 | Discharge: 2022-10-16 | DRG: 322 | Disposition: A | Discharge status: Home / Self Care | Payer: Medicare Other | Attending: Cardiology | Admitting: Cardiology

## 2022-10-14 DIAGNOSIS — I255 Ischemic cardiomyopathy: Secondary | ICD-10-CM | POA: Diagnosis present

## 2022-10-14 DIAGNOSIS — E1122 Type 2 diabetes mellitus with diabetic chronic kidney disease: Secondary | ICD-10-CM | POA: Diagnosis present

## 2022-10-14 DIAGNOSIS — E079 Disorder of thyroid, unspecified: Secondary | ICD-10-CM | POA: Diagnosis present

## 2022-10-14 DIAGNOSIS — F419 Anxiety disorder, unspecified: Secondary | ICD-10-CM | POA: Diagnosis present

## 2022-10-14 DIAGNOSIS — Z888 Allergy status to other drugs, medicaments and biological substances status: Secondary | ICD-10-CM

## 2022-10-14 DIAGNOSIS — Z79899 Other long term (current) drug therapy: Secondary | ICD-10-CM

## 2022-10-14 DIAGNOSIS — Z79890 Hormone replacement therapy (postmenopausal): Secondary | ICD-10-CM | POA: Diagnosis not present

## 2022-10-14 DIAGNOSIS — I5022 Chronic systolic (congestive) heart failure: Secondary | ICD-10-CM

## 2022-10-14 DIAGNOSIS — N183 Chronic kidney disease, stage 3 unspecified: Secondary | ICD-10-CM | POA: Diagnosis present

## 2022-10-14 DIAGNOSIS — I2119 ST elevation (STEMI) myocardial infarction involving other coronary artery of inferior wall: Secondary | ICD-10-CM | POA: Diagnosis present

## 2022-10-14 DIAGNOSIS — I130 Hypertensive heart and chronic kidney disease with heart failure and stage 1 through stage 4 chronic kidney disease, or unspecified chronic kidney disease: Secondary | ICD-10-CM | POA: Diagnosis present

## 2022-10-14 DIAGNOSIS — I213 ST elevation (STEMI) myocardial infarction of unspecified site: Secondary | ICD-10-CM | POA: Insufficient documentation

## 2022-10-14 DIAGNOSIS — E785 Hyperlipidemia, unspecified: Secondary | ICD-10-CM | POA: Diagnosis present

## 2022-10-14 DIAGNOSIS — E890 Postprocedural hypothyroidism: Secondary | ICD-10-CM | POA: Diagnosis present

## 2022-10-14 DIAGNOSIS — Z720 Tobacco use: Secondary | ICD-10-CM | POA: Diagnosis not present

## 2022-10-14 DIAGNOSIS — N1832 Chronic kidney disease, stage 3b: Secondary | ICD-10-CM | POA: Diagnosis present

## 2022-10-14 DIAGNOSIS — I10 Essential (primary) hypertension: Secondary | ICD-10-CM | POA: Diagnosis present

## 2022-10-14 DIAGNOSIS — F17210 Nicotine dependence, cigarettes, uncomplicated: Secondary | ICD-10-CM | POA: Diagnosis present

## 2022-10-14 DIAGNOSIS — E782 Mixed hyperlipidemia: Secondary | ICD-10-CM | POA: Diagnosis present

## 2022-10-14 DIAGNOSIS — I2111 ST elevation (STEMI) myocardial infarction involving right coronary artery: Secondary | ICD-10-CM

## 2022-10-14 DIAGNOSIS — I2510 Atherosclerotic heart disease of native coronary artery without angina pectoris: Secondary | ICD-10-CM | POA: Diagnosis not present

## 2022-10-14 DIAGNOSIS — I252 Old myocardial infarction: Secondary | ICD-10-CM | POA: Diagnosis not present

## 2022-10-14 DIAGNOSIS — E039 Hypothyroidism, unspecified: Secondary | ICD-10-CM | POA: Diagnosis not present

## 2022-10-14 DIAGNOSIS — R7303 Prediabetes: Secondary | ICD-10-CM | POA: Insufficient documentation

## 2022-10-14 DIAGNOSIS — Z955 Presence of coronary angioplasty implant and graft: Secondary | ICD-10-CM

## 2022-10-14 HISTORY — PX: LEFT HEART CATH AND CORONARY ANGIOGRAPHY: CATH118249

## 2022-10-14 HISTORY — PX: CORONARY/GRAFT ACUTE MI REVASCULARIZATION: CATH118305

## 2022-10-14 LAB — CBC WITH DIFFERENTIAL/PLATELET
Abs Immature Granulocytes: 0.06 10*3/uL (ref 0.00–0.07)
Basophils Absolute: 0 10*3/uL (ref 0.0–0.1)
Basophils Relative: 0 %
Eosinophils Absolute: 0.2 10*3/uL (ref 0.0–0.5)
Eosinophils Relative: 2 %
HCT: 39.6 % (ref 36.0–46.0)
Hemoglobin: 13.5 g/dL (ref 12.0–15.0)
Immature Granulocytes: 1 %
Lymphocytes Relative: 42 %
Lymphs Abs: 5.1 10*3/uL — ABNORMAL HIGH (ref 0.7–4.0)
MCH: 32.2 pg (ref 26.0–34.0)
MCHC: 34.1 g/dL (ref 30.0–36.0)
MCV: 94.5 fL (ref 80.0–100.0)
Monocytes Absolute: 1 10*3/uL (ref 0.1–1.0)
Monocytes Relative: 8 %
Neutro Abs: 5.9 10*3/uL (ref 1.7–7.7)
Neutrophils Relative %: 47 %
Platelets: 304 10*3/uL (ref 150–400)
RBC: 4.19 MIL/uL (ref 3.87–5.11)
RDW: 14.6 % (ref 11.5–15.5)
WBC: 12.3 10*3/uL — ABNORMAL HIGH (ref 4.0–10.5)
nRBC: 0 % (ref 0.0–0.2)

## 2022-10-14 LAB — COMPREHENSIVE METABOLIC PANEL
ALT: 8 U/L (ref 0–44)
AST: 14 U/L — ABNORMAL LOW (ref 15–41)
Albumin: 4.3 g/dL (ref 3.5–5.0)
Alkaline Phosphatase: 110 U/L (ref 38–126)
Anion gap: 15 (ref 5–15)
BUN: 20 mg/dL (ref 6–20)
CO2: 19 mmol/L — ABNORMAL LOW (ref 22–32)
Calcium: 9 mg/dL (ref 8.9–10.3)
Chloride: 100 mmol/L (ref 98–111)
Creatinine, Ser: 1.7 mg/dL — ABNORMAL HIGH (ref 0.44–1.00)
GFR, Estimated: 37 mL/min — ABNORMAL LOW (ref >60)
Glucose, Bld: 150 mg/dL — ABNORMAL HIGH (ref 70–99)
Potassium: 3.2 mmol/L — ABNORMAL LOW (ref 3.5–5.1)
Sodium: 134 mmol/L — ABNORMAL LOW (ref 135–145)
Total Bilirubin: 0.4 mg/dL (ref 0.3–1.2)
Total Protein: 7.9 g/dL (ref 6.5–8.1)

## 2022-10-14 LAB — PROTIME-INR
INR: 1 (ref 0.8–1.2)
Prothrombin Time: 12.8 seconds (ref 11.4–15.2)

## 2022-10-14 LAB — LIPID PANEL
Cholesterol: 214 mg/dL — ABNORMAL HIGH (ref 0–200)
HDL: 35 mg/dL — ABNORMAL LOW (ref >40)
LDL Cholesterol: 105 mg/dL — ABNORMAL HIGH (ref 0–99)
Total CHOL/HDL Ratio: 6.1 RATIO
Triglycerides: 369 mg/dL — ABNORMAL HIGH (ref <150)
VLDL: 74 mg/dL — ABNORMAL HIGH (ref 0–40)

## 2022-10-14 LAB — CBC
HCT: 39.2 % (ref 36.0–46.0)
Hemoglobin: 13.5 g/dL (ref 12.0–15.0)
MCH: 32.6 pg (ref 26.0–34.0)
MCHC: 34.4 g/dL (ref 30.0–36.0)
MCV: 94.7 fL (ref 80.0–100.0)
Platelets: 249 10*3/uL (ref 150–400)
RBC: 4.14 MIL/uL (ref 3.87–5.11)
RDW: 14.2 % (ref 11.5–15.5)
WBC: 15.2 10*3/uL — ABNORMAL HIGH (ref 4.0–10.5)
nRBC: 0 % (ref 0.0–0.2)

## 2022-10-14 LAB — ECHOCARDIOGRAM COMPLETE
Area-P 1/2: 5.97 cm2
Height: 60 in
S' Lateral: 2.9 cm
Single Plane A4C EF: 47.3 %
Weight: 2398.6 oz

## 2022-10-14 LAB — TROPONIN I (HIGH SENSITIVITY)
Troponin I (High Sensitivity): 8 ng/L (ref <18)
Troponin I (High Sensitivity): 3819 ng/L (ref <18)

## 2022-10-14 LAB — CREATININE, SERUM
Creatinine, Ser: 1.65 mg/dL — ABNORMAL HIGH (ref 0.44–1.00)
GFR, Estimated: 38 mL/min — ABNORMAL LOW (ref >60)

## 2022-10-14 LAB — MRSA NEXT GEN BY PCR, NASAL: MRSA by PCR Next Gen: NOT DETECTED

## 2022-10-14 LAB — APTT: aPTT: 25 seconds (ref 24–36)

## 2022-10-14 SURGERY — LEFT HEART CATH AND CORONARY ANGIOGRAPHY
Anesthesia: LOCAL

## 2022-10-14 MED ORDER — OXYCODONE-ACETAMINOPHEN 10-325 MG PO TABS: 1.0000 | ORAL_TABLET | ORAL | Status: DC | PRN

## 2022-10-14 MED ORDER — VERAPAMIL HCL 2.5 MG/ML IV SOLN
INTRAVENOUS | Status: DC | PRN
  Administered 2022-10-14: 10 mL via INTRA_ARTERIAL

## 2022-10-14 MED ORDER — HEPARIN SODIUM (PORCINE) 1000 UNIT/ML IJ SOLN
INTRAMUSCULAR | Status: DC | PRN
  Administered 2022-10-14: 5000 [IU] via INTRAVENOUS

## 2022-10-14 MED ORDER — SODIUM CHLORIDE 0.9 % IV SOLN: 250.0000 mL | INTRAVENOUS | Status: DC | PRN

## 2022-10-14 MED ORDER — POTASSIUM CHLORIDE CRYS ER 20 MEQ PO TBCR
40.0000 meq | EXTENDED_RELEASE_TABLET | Freq: Once | ORAL | Status: AC
  Administered 2022-10-14: 40 meq via ORAL
  Filled 2022-10-14: qty 2

## 2022-10-14 MED ORDER — OXYCODONE-ACETAMINOPHEN 5-325 MG PO TABS
1.0000 | ORAL_TABLET | ORAL | Status: DC | PRN
  Administered 2022-10-15 – 2022-10-16 (×3): 1 via ORAL
  Filled 2022-10-14 (×3): qty 1

## 2022-10-14 MED ORDER — ESCITALOPRAM OXALATE 10 MG PO TABS
10.0000 mg | ORAL_TABLET | Freq: Every day | ORAL | Status: DC
  Filled 2022-10-14 (×2): qty 1

## 2022-10-14 MED ORDER — ACETAMINOPHEN 325 MG PO TABS
650.0000 mg | ORAL_TABLET | ORAL | Status: DC | PRN
  Administered 2022-10-15: 650 mg via ORAL
  Filled 2022-10-14: qty 2

## 2022-10-14 MED ORDER — CHLORHEXIDINE GLUCONATE CLOTH 2 % EX PADS
6.0000 | MEDICATED_PAD | Freq: Every day | CUTANEOUS | Status: DC
  Administered 2022-10-14 – 2022-10-16 (×3): 6 via TOPICAL

## 2022-10-14 MED ORDER — VERAPAMIL HCL 2.5 MG/ML IV SOLN
INTRAVENOUS | Status: AC
  Filled 2022-10-14: qty 2

## 2022-10-14 MED ORDER — SODIUM CHLORIDE 0.9% FLUSH
3.0000 mL | Freq: Two times a day (BID) | INTRAVENOUS | Status: DC
  Administered 2022-10-14 – 2022-10-16 (×4): 3 mL via INTRAVENOUS

## 2022-10-14 MED ORDER — ATORVASTATIN CALCIUM 40 MG PO TABS
40.0000 mg | ORAL_TABLET | Freq: Every day | ORAL | Status: DC
  Administered 2022-10-14: 40 mg via ORAL
  Filled 2022-10-14: qty 1

## 2022-10-14 MED ORDER — MIDAZOLAM HCL 2 MG/2ML IJ SOLN
INTRAMUSCULAR | Status: AC
  Filled 2022-10-14: qty 2

## 2022-10-14 MED ORDER — OXYCODONE HCL 5 MG PO TABS
5.0000 mg | ORAL_TABLET | ORAL | Status: DC | PRN
  Administered 2022-10-14 – 2022-10-15 (×3): 5 mg via ORAL
  Filled 2022-10-14 (×3): qty 1

## 2022-10-14 MED ORDER — FUROSEMIDE 10 MG/ML IJ SOLN
20.0000 mg | Freq: Once | INTRAMUSCULAR | Status: AC
  Administered 2022-10-14: 20 mg via INTRAVENOUS
  Filled 2022-10-14: qty 2

## 2022-10-14 MED ORDER — HEPARIN (PORCINE) IN NACL 1000-0.9 UT/500ML-% IV SOLN
INTRAVENOUS | Status: DC | PRN
  Administered 2022-10-14 (×2): 500 mL

## 2022-10-14 MED ORDER — TICAGRELOR 90 MG PO TABS
90.0000 mg | ORAL_TABLET | Freq: Two times a day (BID) | ORAL | Status: DC
  Administered 2022-10-14 – 2022-10-16 (×4): 90 mg via ORAL
  Filled 2022-10-14 (×4): qty 1

## 2022-10-14 MED ORDER — LEVOTHYROXINE SODIUM 88 MCG PO TABS
88.0000 ug | ORAL_TABLET | Freq: Every day | ORAL | Status: DC
  Administered 2022-10-15 – 2022-10-16 (×2): 88 ug via ORAL
  Filled 2022-10-14 (×3): qty 1

## 2022-10-14 MED ORDER — ISOSORBIDE MONONITRATE ER 30 MG PO TB24
30.0000 mg | ORAL_TABLET | Freq: Every day | ORAL | Status: DC
  Administered 2022-10-15 – 2022-10-16 (×2): 30 mg via ORAL
  Filled 2022-10-14 (×2): qty 1

## 2022-10-14 MED ORDER — FOLIC ACID 1 MG PO TABS
0.5000 mg | ORAL_TABLET | Freq: Every day | ORAL | Status: DC
  Administered 2022-10-15 – 2022-10-16 (×2): 0.5 mg via ORAL
  Filled 2022-10-14 (×3): qty 1

## 2022-10-14 MED ORDER — NITROGLYCERIN 1 MG/10 ML FOR IR/CATH LAB
INTRA_ARTERIAL | Status: AC
  Filled 2022-10-14: qty 10

## 2022-10-14 MED ORDER — IOHEXOL 350 MG/ML SOLN
INTRAVENOUS | Status: DC | PRN
  Administered 2022-10-14: 72 mL

## 2022-10-14 MED ORDER — ASPIRIN 81 MG PO CHEW
81.0000 mg | CHEWABLE_TABLET | Freq: Every day | ORAL | Status: DC
  Administered 2022-10-15 – 2022-10-16 (×2): 81 mg via ORAL
  Filled 2022-10-14 (×2): qty 1

## 2022-10-14 MED ORDER — HEPARIN (PORCINE) IN NACL 1000-0.9 UT/500ML-% IV SOLN
INTRAVENOUS | Status: AC
  Filled 2022-10-14: qty 1500

## 2022-10-14 MED ORDER — SODIUM CHLORIDE 0.9% FLUSH: 3.0000 mL | INTRAVENOUS | Status: DC | PRN

## 2022-10-14 MED ORDER — METOPROLOL SUCCINATE ER 50 MG PO TB24
50.0000 mg | ORAL_TABLET | Freq: Every day | ORAL | Status: DC
  Administered 2022-10-14 – 2022-10-16 (×3): 50 mg via ORAL
  Filled 2022-10-14 (×4): qty 1

## 2022-10-14 MED ORDER — FENTANYL CITRATE (PF) 100 MCG/2ML IJ SOLN
INTRAMUSCULAR | Status: DC | PRN
  Administered 2022-10-14: 50 ug via INTRAVENOUS
  Administered 2022-10-14: 25 ug via INTRAVENOUS

## 2022-10-14 MED ORDER — LIDOCAINE HCL (PF) 1 % IJ SOLN
INTRAMUSCULAR | Status: DC | PRN
  Administered 2022-10-14: 2 mL

## 2022-10-14 MED ORDER — HEPARIN SODIUM (PORCINE) 1000 UNIT/ML IJ SOLN
INTRAMUSCULAR | Status: AC
  Filled 2022-10-14: qty 10

## 2022-10-14 MED ORDER — ENOXAPARIN SODIUM 40 MG/0.4ML IJ SOSY
40.0000 mg | PREFILLED_SYRINGE | INTRAMUSCULAR | Status: DC
  Administered 2022-10-15 – 2022-10-16 (×2): 40 mg via SUBCUTANEOUS
  Filled 2022-10-14 (×2): qty 0.4

## 2022-10-14 MED ORDER — HYDRALAZINE HCL 20 MG/ML IJ SOLN: 10.0000 mg | INTRAMUSCULAR | Status: AC | PRN

## 2022-10-14 MED ORDER — NITROGLYCERIN 0.4 MG SL SUBL
0.4000 mg | SUBLINGUAL_TABLET | SUBLINGUAL | Status: DC | PRN
  Administered 2022-10-14: 0.4 mg via SUBLINGUAL
  Filled 2022-10-14: qty 1

## 2022-10-14 MED ORDER — FENTANYL CITRATE (PF) 100 MCG/2ML IJ SOLN
INTRAMUSCULAR | Status: AC
  Filled 2022-10-14: qty 2

## 2022-10-14 MED ORDER — MIDAZOLAM HCL 2 MG/2ML IJ SOLN
INTRAMUSCULAR | Status: DC | PRN
  Administered 2022-10-14: 1 mg via INTRAVENOUS

## 2022-10-14 MED ORDER — ONDANSETRON HCL 4 MG/2ML IJ SOLN: 4.0000 mg | Freq: Four times a day (QID) | INTRAMUSCULAR | Status: DC | PRN

## 2022-10-14 MED ORDER — ASPIRIN 325 MG PO TABS
325.0000 mg | ORAL_TABLET | Freq: Every day | ORAL | Status: DC
  Administered 2022-10-14: 325 mg via ORAL
  Filled 2022-10-14 (×2): qty 1

## 2022-10-14 MED ORDER — LIDOCAINE HCL (PF) 1 % IJ SOLN
INTRAMUSCULAR | Status: AC
  Filled 2022-10-14: qty 30

## 2022-10-14 MED ORDER — NITROGLYCERIN 1 MG/10 ML FOR IR/CATH LAB
INTRA_ARTERIAL | Status: DC | PRN
  Administered 2022-10-14: 200 ug via INTRACORONARY

## 2022-10-14 SURGICAL SUPPLY — 21 items
BALL SAPPHIRE NC24 3.5X26 (BALLOONS) ×1
BALLN EMERGE MR 2.0X12 (BALLOONS) ×1
BALLOON EMERGE MR 2.0X12 (BALLOONS) IMPLANT
BALLOON SAPPHIRE NC24 3.5X26 (BALLOONS) IMPLANT
CATH INFINITI 5FR ANG PIGTAIL (CATHETERS) IMPLANT
CATH INFINITI 5FR JL4 (CATHETERS) IMPLANT
CATH LAUNCHER 6FR JR4 (CATHETERS) IMPLANT
DEVICE RAD COMP TR BAND LRG (VASCULAR PRODUCTS) IMPLANT
ELECT DEFIB PAD ADLT CADENCE (PAD) IMPLANT
GLIDESHEATH SLEND SS 6F .021 (SHEATH) IMPLANT
GUIDEWIRE INQWIRE 1.5J.035X260 (WIRE) IMPLANT
INQWIRE 1.5J .035X260CM (WIRE) ×1
KIT ENCORE 26 ADVANTAGE (KITS) IMPLANT
KIT HEART LEFT (KITS) ×1 IMPLANT
PACK CARDIAC CATHETERIZATION (CUSTOM PROCEDURE TRAY) ×1 IMPLANT
SHEATH PROBE COVER 6X72 (BAG) IMPLANT
STENT SYNERGY XD 2.75X38 (Permanent Stent) IMPLANT
SYNERGY XD 2.75X38 (Permanent Stent) ×1 IMPLANT
TRANSDUCER W/STOPCOCK (MISCELLANEOUS) ×1 IMPLANT
TUBING CIL FLEX 10 FLL-RA (TUBING) ×1 IMPLANT
WIRE ASAHI PROWATER 180CM (WIRE) IMPLANT

## 2022-10-14 NOTE — Progress Notes (Signed)
  Echocardiogram 2D Echocardiogram has been performed.  [name] [name] 10/14/2022, 4:05 PM

## 2022-10-14 NOTE — ED Notes (Signed)
She has just left with [facility]. She remains awake, alert and breathing normally. Her skin is mildly flushed. She tells me her pain was somewhat, but not completely relieved by ntg. Only one ntg given r/t b/p.

## 2022-10-14 NOTE — ED Provider Notes (Signed)
[facility] EMERGENCY DEPT Provider Note   CSN: [id number] Arrival date & time: 10/14/22  1229     History  Chief Complaint  Patient presents with   Chest Pain    [name] [name] is a 47 y.o. female with history of type 2 diabetes, high cholesterol, coronary disease status post MI, presented to ED with chest pain and nausea and vomiting.  Symptoms began approximately 10 minutes prior to arrival in the ED while the patient was shopping at [place].  She describes pressure and pain in her chest.  Nausea and lightheadedness.  She reports a history of MI in the past as her cardiologist was at [facility] from April 2023 as below   Diagnostic angiogram results:  Left main coronary artery: The left main coronary artery arises from the  left sinus of Valsalva.  Large.  Divides into the left anterior descending  artery and left circumflex artery.  Angiographically normal.  Left anterior descending artery: 100% occlusion in early mid segment just  after the origin of a single large diagonal branch and first septal  perforator.  Left circumflex artery: Medium to large.  Nondominant.  Gives rise to a  single large obtuse marginal branch.  The AV branch is very small.  There  are luminal irregularities with 10 to 20% narrowing in the proximal  segment.  Right coronary artery: Originates from right sinus of valsalva.  Medium  sized, dominant for posterior circulation.  The PDA is medium sized and  supplies the basal to apical inferior wall.  The AV branch gives rise to a  small distal posterolateral branch.  There is a long, tubular concentric  stenosis extending from the proximal to mid segment with up to 70%  narrowing at its distal end.  The remainder of the vessel is  angiographically normal.  Left ventricle: End-diastolic pressure 19 mmHg   Summary:  Late presentation of anterior MI.  Occlusion of left anterior descending artery.  Flow-limiting stenosis of  proximal right coronary artery   Plan:  Admit to telemetry.  Pursue viability study.  While not likely, if viability is demonstrated in  the LAD territory, refer patient for CABG.   HPI     Home Medications Prior to Admission medications   Medication Sig Start Date End Date Taking? Authorizing Provider  atorvastatin (LIPITOR) 40 MG tablet Take 40 mg by mouth daily.    [provider]  carisoprodol (SOMA) 350 MG tablet carisoprodol 350 mg tablet    [provider]  cyclobenzaprine (FLEXERIL) 10 MG tablet Take 10 mg by mouth every 12 (twelve) hours as needed. 09/13/18   [provider]  doxycycline (VIBRAMYCIN) 100 MG capsule Take 1 capsule (100 mg total) by mouth 2 (two) times daily. 04/25/21   Benjiman Core, MD  escitalopram (LEXAPRO) 10 MG tablet Take 10 mg by mouth daily.    [provider]  folic acid (FOLVITE) 400 MCG tablet Take by mouth.    [provider]  hydrocortisone 2.5 % cream APPLY ONE APPLICATION TOPICALLY 3 TIMES DAILY 07/27/18   [provider]  levothyroxine (SYNTHROID, LEVOTHROID) 88 MCG tablet Take 88 mcg by mouth daily. 11/23/18   [provider]  lidocaine (XYLOCAINE) 2 % jelly Apply topically daily as needed. 11/20/20   [provider]  losartan (COZAAR) 100 MG tablet Take by mouth. 12/14/19   [provider]  metoprolol tartrate (LOPRESSOR) 50 MG tablet Take 50 mg by mouth 2 (two) times daily.  11/23/18   [provider]  oxyCODONE-acetaminophen (PERCOCET) 10-325 MG tablet Take 1 tablet by mouth every 4 (four) hours as needed for pain.    [provider]  polyethylene glycol powder (GLYCOLAX/MIRALAX) powder Take by mouth.    [provider]  promethazine (PHENERGAN) 12.5 MG tablet promethazine 12.5 mg tablet 03/14/18   [provider]  Vitamin D, Ergocalciferol, (DRISDOL) 1.25 MG (50000 UT) CAPS capsule 50,000 Units once a week. 01/25/18   [provider]      Allergies    Hydrochlorothiazide, Cortisone, and Pravastatin    Review of Systems   Review of Systems  Physical Exam Updated Vital Signs BP 117/83   Pulse 76   Temp 97.7 F (36.5 C)   Resp 14   Ht 5' (1.524 m)   Wt 68 kg   SpO2 100%   BMI 29.28 kg/m  Physical Exam  ED Results / Procedures / Treatments   Labs (all labs ordered are listed, but only abnormal results are displayed) Labs Reviewed  CBC WITH DIFFERENTIAL/PLATELET - Abnormal; Notable for the following components:      Result Value   WBC 12.3 (*)    Lymphs Abs 5.1 (*)    All other components within normal limits  PROTIME-INR  APTT  COMPREHENSIVE METABOLIC PANEL  LIPID PANEL  TROPONIN I (HIGH SENSITIVITY)    EKG None  Radiology No results found.  Procedures .Critical Care  Performed by: Wyvonnia Dusky, MD Authorized by: Wyvonnia Dusky, MD   Critical care provider statement:    Critical care time (minutes):  30   Critical care time was exclusive of:  Separately billable procedures and treating other patients   Critical care was necessary to treat or prevent imminent or life-threatening deterioration of the following conditions:  Circulatory failure   Critical care was time spent personally by me on the following activities:  Ordering and performing treatments and interventions, ordering and review of laboratory studies, ordering and review of radiographic studies, pulse oximetry, review of old charts, examination of patient and evaluation of patient's response to treatment   Care discussed with: admitting provider   Comments:     Heparin for STEMI     Medications Ordered in ED Medications  aspirin tablet 325 mg (325 mg Oral Given 10/14/22 1255)  nitroGLYCERIN (NITROSTAT) SL tablet 0.4 mg (0.4 mg Sublingual Given 10/14/22 1256)    ED Course/ Medical Decision Making/ A&P Clinical Course as of 10/14/22 1303  Wed Oct 14, 2022  1250 Activated code stemi [MT]  1259 Spoke to  [place] and Dr Tamala Julian from cardiology, who accept patient for admission to cardiology service.  Patient's husband updated.  [facility] is here for transport [MT]    Clinical Course User Index [MT] Ammara Raj, Carola Rhine, MD                           Medical Decision Making Amount and/or Complexity of Data Reviewed Labs: ordered.  Risk OTC drugs. Prescription drug management. Decision regarding hospitalization.   This patient presents to the ED with concern for chest pain. This involves an extensive number of treatment options, and is a complaint that carries with it a high risk of complications and morbidity.  The differential diagnosis includes ACS versus pneumonia versus pulmonary embolism versus other  Co-morbidities that complicate the patient evaluation: History of coronary disease and cardiac risk factors at high risk for coronary disease  Additional history obtained  from husband at bedside  External records from outside source obtained and reviewed including [place] report from April as noted above  I ordered and personally interpreted labs.  The pertinent results include:  WBC 12.3.  Trop pending at the time of stat transfer to [facility] for STEMI evaluation.   The patient was maintained on a cardiac monitor.  I personally viewed and interpreted the cardiac monitored which showed an underlying rhythm of: NSR  Per my interpretation the patient's ECG shows inferior STEMI with lateral ST depressions  I ordered medication including aspirin, SL nitro, heparin for STEMI  I have reviewed the patients home medicines and have made adjustments as needed  Test Considered: lower suspicion for acute PE, PTX clinically  I requested consultation with the cardiology, and discussed lab and imaging findings as well as pertinent plan - they recommend: transfer and admission to cardiology  After the interventions noted above, I reevaluated the patient and found that they have: stayed the  same  Dispostion:  After consideration of the diagnostic results and the patients response to treatment, I feel that the patent would benefit from medical admission to [facility].         Final Clinical Impression(s) / ED Diagnoses Final diagnoses:  ST elevation myocardial infarction (STEMI), unspecified artery [facility])    Rx / DC Orders ED Discharge Orders     None         Wyvonnia Dusky, MD 10/14/22 1303

## 2022-10-14 NOTE — H&P (Signed)
Cardiology Admission History and Physical   Patient ID: [name] MRN: [id number]; DOB: 1976-02-14   Admission date: 10/14/2022  PCP:  Angelica Ran, MD   [place] Providers Cardiologist:  None    Followed by [facility] Click here to update MD or APP on Care Team, Refresh:1}     Chief Complaint:  chest pain  Patient Profile:   [name] [name] is a 47 y.o. female with history notable for CAD with late presenting MI, ischemic cardiomyopathy, HFrEF (35-40%), hypertension, CKD stage IIIa, DM type II, hyperlipidemia, hypothyroidism, CRPS who is being seen 10/14/2022 for the evaluation of STEMI.  History of Present Illness:   [name] presented to the [place] ED today after she experienced sudden onset substernal chest pain while she was out shopping. Also reports concurrent dyspnea and nausea. Pain does radiate to her back as well. Patient is an active smoker but has cut back significantly. Patient seen in the cath lab prior to LHC/PCI. She reports improvement in chest pain following administration of nitro. She continues to experience back pain rated 8/10.  Her cardiac history is notable for late presenting anterior septal myocardial infarction in April 2023. Cardiac catheterization showed occluded left anterior descending artery along with 70% proximal RCA stenosis. Viability study showed no area of viability in regards to the LAD. No RCA intervention. Echocardiogram at the time showed LVEF of 35 to 40% with anterior septal and anterior hypokinesis. Patient was placed on HF GDMT including metoprolol, Lasix, Entresto. Repeat TTE in August 2023 showed that LVEF remained stable, 35-40% with continued wall motion abnormalities. LVIDD 4.3, mild MR and trace TR. Patient is followed by [facility] and has generally been doing well. Her last OV on 12/5 reported some side effects following initiation of Spironolactone. She apparently passed out while  waiting in line to see the [employer].   Past Medical History:  Diagnosis Date   Anxiety    Arthritis    ankle, left wrist, back   Diverticulitis    History of removal of ovarian cyst    87   History of skull fracture age 65   no residual effects   Hyperlipidemia    Hypothyroidism    Neuromuscular disorder ([place])    Stenosing tenosynovitis of finger 10/2012   right middle finger   Thyroid disease    Wears partial dentures    upper    Past Surgical History:  Procedure Laterality Date   ABDOMINAL ADHESION SURGERY     ABDOMINAL HYSTERECTOMY  2001   complete   ABDOMINAL HYSTERECTOMY     APPENDECTOMY     CARPAL TUNNEL RELEASE Right 06/22/2017   Procedure: RIGHT CARPAL TUNNEL RELEASE;  Surgeon: Daryll Brod, MD;  Location: [place];  Service: Orthopedics;  Laterality: Right;  AXILLARY BLOCK   CHOLECYSTECTOMY     gum cyst removal     LAPAROSCOPY     left ankle ligament repair     MOUTH SURGERY     removal of cysts - x 4 surgeries   PARTIAL HYSTERECTOMY  1989   THYROIDECTOMY     TOTAL THYROIDECTOMY     TRIGGER FINGER RELEASE Right 11/18/2012   Procedure: RELEASE TRIGGER FINGER/A-1 PULLEY;  Surgeon: Wynonia Sours, MD;  Location: [place];  Service: Orthopedics;  Laterality: Right;   TRIGGER FINGER RELEASE     TRIGGER FINGER RELEASE Right 06/22/2017   Procedure: RELEASE TRIGGER FINGER/A-1 PULLEY RIGHT THUMB;  Surgeon:  Cindee Salt, MD;  Location: [facility];  Service: Orthopedics;  Laterality: Right;   WRIST ARTHROSCOPY W/ TRIANGULAR FIBROCARTILAGE REPAIR  03/25/2011   and debridement LT tear     Medications Prior to Admission: Prior to Admission medications   Medication Sig Start Date End Date Taking? Authorizing Provider  atorvastatin (LIPITOR) 40 MG tablet Take 40 mg by mouth daily.    [provider]  carisoprodol (SOMA) 350 MG tablet carisoprodol 350 mg tablet    [provider]  cyclobenzaprine  (FLEXERIL) 10 MG tablet Take 10 mg by mouth every 12 (twelve) hours as needed. 09/13/18   [provider]  doxycycline (VIBRAMYCIN) 100 MG capsule Take 1 capsule (100 mg total) by mouth 2 (two) times daily. 04/25/21   Benjiman Core, MD  escitalopram (LEXAPRO) 10 MG tablet Take 10 mg by mouth daily.    [provider]  folic acid (FOLVITE) 400 MCG tablet Take by mouth.    [provider]  hydrocortisone 2.5 % cream APPLY ONE APPLICATION TOPICALLY 3 TIMES DAILY 07/27/18   [provider]  levothyroxine (SYNTHROID, LEVOTHROID) 88 MCG tablet Take 88 mcg by mouth daily. 11/23/18   [provider]  lidocaine (XYLOCAINE) 2 % jelly Apply topically daily as needed. 11/20/20   [provider]  losartan (COZAAR) 100 MG tablet Take by mouth. 12/14/19   [provider]  metoprolol tartrate (LOPRESSOR) 50 MG tablet Take 50 mg by mouth 2 (two) times daily. 11/23/18   [provider]  oxyCODONE-acetaminophen (PERCOCET) 10-325 MG tablet Take 1 tablet by mouth every 4 (four) hours as needed for pain.    [provider]  polyethylene glycol powder (GLYCOLAX/MIRALAX) powder Take by mouth.    [provider]  promethazine (PHENERGAN) 12.5 MG tablet promethazine 12.5 mg tablet 03/14/18   [provider]  Vitamin D, Ergocalciferol, (DRISDOL) 1.25 MG (50000 UT) CAPS capsule 50,000 Units once a week. 01/25/18   [provider]     Allergies:    Allergies  Allergen Reactions   Hydrochlorothiazide Other (See Comments)    Electrolyte abnormalities of hyponatremia and hypokalemia   Cortisone     Says her wrist up to elbow turned blue.  Has had some type of stroid in back and foot though without the same reaction   Pravastatin Other (See Comments)    Chest discomfort    Social History:   Social History   Socioeconomic History   Marital status: Married    Spouse name: Not on file   Number of children: Not on file    Years of education: Not on file   Highest education level: Not on file  Occupational History   Not on file  Tobacco Use   Smoking status: Every Day    Packs/day: 1.00    Years: 20.00    Total pack years: 20.00    Types: Cigarettes   Smokeless tobacco: Never  Vaping Use   Vaping Use: Never used  Substance and Sexual Activity   Alcohol use: No   Drug use: No   Sexual activity: Not on file  Other Topics Concern   Not on file  Social History Narrative   ** Merged History Encounter **       Social Determinants of Health   Financial Resource Strain: Not on file  Food Insecurity: Not on file  Transportation Needs: Not on file  Physical Activity: Not on file  Stress: Not on file  Social Connections: Not on  file  Intimate Partner Violence: Not on file    Family History:   The patient's family history is not on file.    ROS:  Please see the history of present illness.  All other ROS reviewed and negative.     Physical Exam/Data:   Vitals:   10/14/22 1236 10/14/22 1239 10/14/22 1250 10/14/22 1259  BP:  124/87 119/86 117/83  Pulse:  76 79 76  Resp:  16 13 14   Temp:  97.7 F (36.5 C)    SpO2:  100% 100% 100%  Weight: 68 kg     Height: 5' (1.524 m)      No intake or output data in the 24 hours ending 10/14/22 1321    10/14/2022   12:36 PM 04/25/2021   12:15 AM 01/19/2019    9:35 AM  Last 3 Weights  Weight (lbs) 149 lb 14.6 oz 150 lb 130 lb  Weight (kg) 68 kg 68.04 kg 58.968 kg     Body mass index is 29.28 kg/m.   Physical Exam Constitutional:      General: She is not in acute distress.    Appearance: She is well-developed. She is ill-appearing.  HENT:     Head: Normocephalic and atraumatic.  Eyes:     Pupils: Pupils are equal, round, and reactive to light.  Cardiovascular:     Rate and Rhythm: Normal rate and regular rhythm.     Heart sounds: Normal heart sounds. No murmur heard. Pulmonary:     Effort: Pulmonary effort is normal.     Breath sounds: Normal  breath sounds.  Abdominal:     General: Bowel sounds are normal.     Palpations: Abdomen is soft.  Musculoskeletal:        General: Normal range of motion.     Cervical back: Normal range of motion and neck supple.     Right lower leg: No edema.     Left lower leg: No edema.  Skin:    General: Skin is warm and dry.     Capillary Refill: Capillary refill takes 2 to 3 seconds.  Neurological:     General: No focal deficit present.     Mental Status: She is alert and oriented to person, place, and time.       EKG:  The ECG that was done 10/14/22 was personally reviewed and demonstrates ST elevation >42mm in leads III, AVF with T wave inversions in AVR, AVL, lead I, II.  Relevant CV Studies:  09/08/22 Heart monitor  The patient was monitored for a total of 13d 6h, underlying rhythm is Sinus. The minimum heart rate was 53 bpm; the maximum 129 bpm; the average 73 bpm. 0 % of Atrial fibrillation/Atrial flutter. The total burden of AV Block present was 0 %. There were 0 pauses. Total count of Ventricular Tachycardia (VT): 0 episode(s). 0 supraventricular episodes were found. There were a total of 27 PVCs with 1 morphologies and 4 couplets. Overall PVC Burden at < 0.01 % There were a total of 0 Other Beats. There were 0 total number of paced beats. There were a total of 12 PSVCs with 1 morphologies and 0 couplets. Overall PSVC Burden at < 0.01 % There is a total of 0 patient events.  SUMMARY: Underlying sinus rhythm.   No sustained arrhythmias. Rare PAC's and rare PVC's No patient triggers for symptoms.   05/27/22 TTE  Severe hypokinesis to akinesis of the apex of the left ventricle is present. Left Ventricle  Left ventricle size is normal. Wall thickness is normal. EF: 35-40%. Quantitative analysis of left ventricular Global Longitudinal Strain (GLS) imaging is -11.500%. Ejection fraction measured by 3D is 58%, Wall motion abnormalities as outlined in graphic representation.  Doppler parameters indicate normal diastolic function.  Right Ventricle Right ventricle size is normal. Systolic function is normal.  Left Atrium Left atrium size is normal.  Right Atrium Right atrium size is normal.  IVC/SVC The inferior vena cava demonstrates a diameter of <=2.1 cm and collapses >50%; therefore, the right atrial pressure is estimated at 3 mmHg.  Mitral Valve Mitral valve structure is normal. There is mild regurgitation. There is no evidence of mitral valve stenosis.  Tricuspid Valve Tricuspid valve structure is normal. There is trace regurgitation. The right ventricular systolic pressure is normal (<36 mmHg).  Aortic Valve The aortic valve is tricuspid. The leaflets are not thickened and exhibit normal excursion. There is no regurgitation or stenosis.  Pulmonic Valve The pulmonic valve was not well visualized. Trace regurgitation.  Ascending Aorta The aortic root is normal in size. The ascending aorta is normal in size.  Pericardium There is no pericardial effusion.  Study Details A complete echo was performed using complete 2D, color flow Doppler, spectral Doppler, strain and 3D imaging. The imaging is on file and stored in a permanent location.  Wall Scoring Baseline Score Index: 1.29 The following segments are akinetic: apical septal and apex. The following segments are hypokinetic: apical lateral. All other segments are normal.    Laboratory Data:  High Sensitivity Troponin:   Recent Labs  Lab 10/14/22 1240  TROPONINIHS 8      Chemistry Recent Labs  Lab 10/14/22 1240  NA 134*  K 3.2*  CL 100  CO2 19*  GLUCOSE 150*  BUN 20  CREATININE 1.70*  CALCIUM 9.0  GFRNONAA 37*  ANIONGAP 15    Recent Labs  Lab 10/14/22 1240  PROT 7.9  ALBUMIN 4.3  AST 14*  ALT 8  ALKPHOS 110  BILITOT 0.4   Lipids No results for input(s): "CHOL", "TRIG", "HDL", "LABVLDL", "LDLCALC", "CHOLHDL" in the last 168 hours. Hematology Recent Labs  Lab  10/14/22 1240  WBC 12.3*  RBC 4.19  HGB 13.5  HCT 39.6  MCV 94.5  MCH 32.2  MCHC 34.1  RDW 14.6  PLT 304   Thyroid No results for input(s): "TSH", "FREET4" in the last 168 hours. BNPNo results for input(s): "BNP", "PROBNP" in the last 168 hours.  DDimer No results for input(s): "DDIMER" in the last 168 hours.   Radiology/Studies:  No results found.   Assessment and Plan:   Inferior STEMI  Patient presented to the [facility] ED today with acute onset substernal chest pain, dyspnea, and nausea. ECG consistent with inferior STEMI, patient transferred to [facility] for LHC. Her cardiac history is notable for a late presenting anterior septal myocardial infarction in April 2023. Cardiac catheterization showed occluded left anterior descending artery along with 70% proximal RCA stenosis. Viability study showed no area of viability in regards to the LAD. No RCA intervention.  LHC today with Dr. [place] DAPT and GDMT per Dr. [place] following cath today. Has been on ASA/Brilinta. Continue Metoprolol Continue Imdur Continue high dose Atorvastatin  HFrEF (35-40%) Ischemic cardiomyopathy  Patient with severe hypokinesis to akinesis of the apex of the left ventricle following late presenting NSTEMI this past April 2023.   On GMDT including Entresto 24/26 mg AM. May need to hold in the setting of mild AKI.  Continue  Metoprolol Consider initiation of SGLT2 Recent hx intolerance to Spironolactone  DM type II  Plan for SSI following LHC today.  CKD stage IIIa  Plan for cautious IV fluids following LHC today. Closely monitor renal function. Baseline creatine appears to be around 1.23, up to 1.70 today.   Hypertension  Manage with above GDMT including Metoprolol. Sherryll Burger may need to be held.   Hypothyroidism  Continue home dose Levothyroxine QD AM.  Risk Assessment/Risk Scores:    TIMI Risk Score for ST  Elevation MI:   The patient's TIMI risk score is 1, which indicates a  1.6% risk of all cause mortality at 30 days.   New York Heart Association (NYHA) Functional Class NYHA Class I     Severity of Illness: The appropriate patient status for this patient is OBSERVATION. Observation status is judged to be reasonable and necessary in order to provide the required intensity of service to ensure the patient's safety. The patient's presenting symptoms, physical exam findings, and initial radiographic and laboratory data in the context of their medical condition is felt to place them at decreased risk for further clinical deterioration. Furthermore, it is anticipated that the patient will be medically stable for discharge from the hospital within 2 midnights of admission.    For questions or updates, please contact [facility] Please consult [email or web address] for contact info under     Signed, Perlie Gold, PA-C  10/14/2022 1:21 PM

## 2022-10-14 NOTE — Progress Notes (Signed)
CXP not done due to patient already transferred via [place]

## 2022-10-14 NOTE — ED Notes (Signed)
12:40 Thomas at [facility] called for CODE STEMI. ABB(NS)

## 2022-10-14 NOTE — ED Triage Notes (Signed)
Patient here POV from Home.  Endorses Sudden Onset of CP in Mid Chest that began while shopping approximately 15-30 minutes ago. Some SOB and Nausea. No Emesis. Radiates to Back at times.   NAD Noted during Triage. A&Ox4. GCS 15. BIB Wheelchair.

## 2022-10-15 ENCOUNTER — Other Ambulatory Visit (HOSPITAL_COMMUNITY): Payer: Self-pay

## 2022-10-15 ENCOUNTER — Encounter (HOSPITAL_COMMUNITY): Payer: Self-pay | Admitting: Cardiology

## 2022-10-15 DIAGNOSIS — I255 Ischemic cardiomyopathy: Secondary | ICD-10-CM

## 2022-10-15 DIAGNOSIS — E039 Hypothyroidism, unspecified: Secondary | ICD-10-CM | POA: Diagnosis not present

## 2022-10-15 DIAGNOSIS — N1832 Chronic kidney disease, stage 3b: Secondary | ICD-10-CM

## 2022-10-15 DIAGNOSIS — Z720 Tobacco use: Secondary | ICD-10-CM

## 2022-10-15 LAB — CBC
HCT: 38.8 % (ref 36.0–46.0)
HCT: 43.5 % (ref 36.0–46.0)
Hemoglobin: 13.6 g/dL (ref 12.0–15.0)
Hemoglobin: 15 g/dL (ref 12.0–15.0)
MCH: 32.4 pg (ref 26.0–34.0)
MCH: 31.9 pg (ref 26.0–34.0)
MCHC: 35.1 g/dL (ref 30.0–36.0)
MCHC: 34.5 g/dL (ref 30.0–36.0)
MCV: 92.4 fL (ref 80.0–100.0)
MCV: 92.6 fL (ref 80.0–100.0)
Platelets: 245 10*3/uL (ref 150–400)
Platelets: 320 10*3/uL (ref 150–400)
RBC: 4.2 MIL/uL (ref 3.87–5.11)
RBC: 4.7 MIL/uL (ref 3.87–5.11)
RDW: 14.1 % (ref 11.5–15.5)
RDW: 14.2 % (ref 11.5–15.5)
WBC: 12.1 10*3/uL — ABNORMAL HIGH (ref 4.0–10.5)
WBC: 13.8 10*3/uL — ABNORMAL HIGH (ref 4.0–10.5)
nRBC: 0 % (ref 0.0–0.2)
nRBC: 0 % (ref 0.0–0.2)

## 2022-10-15 LAB — LIPID PANEL
Cholesterol: 219 mg/dL — ABNORMAL HIGH (ref 0–200)
HDL: 30 mg/dL — ABNORMAL LOW (ref >40)
LDL Cholesterol: 110 mg/dL — ABNORMAL HIGH (ref 0–99)
Total CHOL/HDL Ratio: 7.3 RATIO
Triglycerides: 394 mg/dL — ABNORMAL HIGH (ref <150)
VLDL: 79 mg/dL — ABNORMAL HIGH (ref 0–40)

## 2022-10-15 LAB — BASIC METABOLIC PANEL
Anion gap: 13 (ref 5–15)
BUN: 18 mg/dL (ref 6–20)
CO2: 20 mmol/L — ABNORMAL LOW (ref 22–32)
Calcium: 8.7 mg/dL — ABNORMAL LOW (ref 8.9–10.3)
Chloride: 101 mmol/L (ref 98–111)
Creatinine, Ser: 1.52 mg/dL — ABNORMAL HIGH (ref 0.44–1.00)
GFR, Estimated: 42 mL/min — ABNORMAL LOW (ref >60)
Glucose, Bld: 132 mg/dL — ABNORMAL HIGH (ref 70–99)
Potassium: 4.3 mmol/L (ref 3.5–5.1)
Sodium: 134 mmol/L — ABNORMAL LOW (ref 135–145)

## 2022-10-15 LAB — MAGNESIUM: Magnesium: 2.1 mg/dL (ref 1.7–2.4)

## 2022-10-15 LAB — POCT ACTIVATED CLOTTING TIME: Activated Clotting Time: 439 seconds

## 2022-10-15 MED ORDER — ATORVASTATIN CALCIUM 80 MG PO TABS
80.0000 mg | ORAL_TABLET | Freq: Every day | ORAL | Status: DC
  Administered 2022-10-15 – 2022-10-16 (×2): 80 mg via ORAL
  Filled 2022-10-15 (×2): qty 1

## 2022-10-15 MED ORDER — SACUBITRIL-VALSARTAN 24-26 MG PO TABS
1.0000 | ORAL_TABLET | Freq: Two times a day (BID) | ORAL | Status: DC
  Administered 2022-10-15 – 2022-10-16 (×3): 1 via ORAL
  Filled 2022-10-15 (×4): qty 1

## 2022-10-15 NOTE — Progress Notes (Signed)
CARDIAC REHAB PHASE I   PRE:  Rate/Rhythm: 96 NSR  BP:  Sitting: 126/93      SaO2: 97 RA  MODE:  Ambulation: 190 ft   AD:  None  POST:  Rate/Rhythm: 122 ST  BP:  Sitting: 129/98      SaO2: 99 RA  Pt amb with standby assistance, pt denies CP, and SOB during amb and was returned to room w/o complaint.  Post-ambulation RPE 11 (scale 6-20) Pt walked well, denies pain  Pt was educated on stent card, stent location, Birlinta and ASA use, Plavis if needed, wt restrictions, no baths/daily wash-ups, s/s of infection, ex guidelines (low-impact exercise and flexibility), s/s to stop exercising, NTG use and calling 911, heart healthy diet, risk factors (smoking, High LDLs), and CRPII. Pt received MI book and materials on exercise, diet, and smoking cessation) Will refer to [facility].   Pt still smokes half a cigarette randomly when she has the urge. Feels guilty after, provided counseling.   Pt is looking forward to [place]  10:20 AM 10/15/2022    Service time is from 0915 to 1032.

## 2022-10-15 NOTE — Progress Notes (Signed)
Rounding Note    Patient Name: [name] [name] Date of Encounter: 10/15/2022  [facility] Cardiologist: Dr. Eddie Candle at [facility] cardiology  Subjective   No recurrent chest pain  Inpatient Medications    Scheduled Meds:  aspirin  81 mg Oral Daily   atorvastatin  40 mg Oral Daily   Chlorhexidine Gluconate Cloth  6 each Topical Q0600   enoxaparin (LOVENOX) injection  40 mg Subcutaneous Q24H   escitalopram  10 mg Oral Daily   folic acid  0.5 mg Oral Daily   isosorbide mononitrate  30 mg Oral Daily   levothyroxine  88 mcg Oral Daily   metoprolol succinate  50 mg Oral Daily   sodium chloride flush  3 mL Intravenous Q12H   ticagrelor  90 mg Oral BID   Continuous Infusions:  sodium chloride 10 mL/hr at 10/15/22 0600   PRN Meds: sodium chloride, acetaminophen, nitroGLYCERIN, ondansetron (ZOFRAN) IV, oxyCODONE-acetaminophen **AND** oxyCODONE, sodium chloride flush   Vital Signs    Vitals:   10/15/22 0400 10/15/22 0500 10/15/22 0600 10/15/22 0700  BP: 124/88 112/76 112/73 105/76  Pulse: (!) 102 86 87 92  Resp: 19 13 11 16   Temp: 98.2 F (36.8 C)   98.1 F (36.7 C)  TempSrc: Oral     SpO2: 97% 97% 97% 98%  Weight:  69.1 kg    Height:        Intake/Output Summary (Last 24 hours) at 10/15/2022 0829 Last data filed at 10/15/2022 0600 Gross per 24 hour  Intake 160 ml  Output --  Net 160 ml      10/15/2022    5:00 AM 10/14/2022   12:36 PM 04/25/2021   12:15 AM  Last 3 Weights  Weight (lbs) 152 lb 5.4 oz 149 lb 14.6 oz 150 lb  Weight (kg) 69.1 kg 68 kg 68.04 kg      Telemetry    Sinus rhythm at 96 - Personally Reviewed  ECG    10/15/2021 ECG (independently read by me): Normal sinus rhythm at 86, right bundle branch block.  Small atrial septal Q wave V1 through V3.  Small inferior Q waves- Personally Reviewed  Physical Exam    BP 105/76   Pulse 92   Temp 98.1 F (36.7 C)   Resp 16   Ht 5' (1.524 m)   Wt 69.1 kg   SpO2 98%   BMI 29.75 kg/m   General: Alert, oriented, no distress.  Skin: normal turgor, no rashes, warm and dry HEENT: Normocephalic, atraumatic. Pupils equal round and reactive to light; sclera anicteric; extraocular muscles intact; Nose without nasal septal hypertrophy Mouth/Parynx benign; Mallinpatti scale 3 Neck: Thick neck; no JVD, no carotid bruits; normal carotid upstroke Lungs: clear to ausculatation and percussion; no wheezing or rales Chest wall: without tenderness to palpitation Heart: PMI not displaced, RRR, s1 s2 normal, 1/6 systolic murmur, no diastolic murmur, no rubs, gallops, thrills, or heaves Abdomen: soft, nontender; no hepatosplenomehaly, BS+; abdominal aorta nontender and not dilated by palpation. Back: no CVA tenderness Pulses 2+ right radial site stable, no ecchymosis Musculoskeletal: full range of motion, normal strength, no joint deformities Extremities: no clubbing cyanosis or edema, Homan's sign negative  Neurologic: grossly nonfocal; Cranial nerves grossly wnl Psychologic: Normal mood and affect   Labs    High Sensitivity Troponin:   Recent Labs  Lab 10/14/22 1240 10/14/22 1500  TROPONINIHS 8 3,819*     Chemistry Recent Labs  Lab 10/14/22 1240 10/14/22 1500 10/15/22 0309  NA  134*  --  134*  K 3.2*  --  4.3  CL 100  --  101  CO2 19*  --  20*  GLUCOSE 150*  --  132*  BUN 20  --  18  CREATININE 1.70* 1.65* 1.52*  CALCIUM 9.0  --  8.7*  MG  --   --  2.1  PROT 7.9  --   --   ALBUMIN 4.3  --   --   AST 14*  --   --   ALT 8  --   --   ALKPHOS 110  --   --   BILITOT 0.4  --   --   GFRNONAA 37* 38* 42*  ANIONGAP 15  --  13    Lipids  Recent Labs  Lab 10/15/22 0309  CHOL 219*  TRIG 394*  HDL 30*  LDLCALC 110*  CHOLHDL 7.3    Hematology Recent Labs  Lab 10/14/22 1240 10/14/22 1500 10/15/22 0309  WBC 12.3* 15.2* 12.1*  RBC 4.19 4.14 4.20  HGB 13.5 13.5 13.6  HCT 39.6 39.2 38.8  MCV 94.5 94.7 92.4  MCH 32.2 32.6 32.4  MCHC 34.1 34.4 35.1  RDW 14.6 14.2  14.1  PLT 304 249 245   Thyroid No results for input(s): "TSH", "FREET4" in the last 168 hours.  BNPNo results for input(s): "BNP", "PROBNP" in the last 168 hours.  DDimer No results for input(s): "DDIMER" in the last 168 hours.   Radiology    ECHOCARDIOGRAM COMPLETE  Result Date: 10/14/2022    ECHOCARDIOGRAM REPORT   Patient Name:   OCEANE FOSSE Date of Exam: 10/14/2022 Medical Rec #:  [id number]         Height:       60.0 in Accession #:    [id number]        Weight:       149.9 lb Date of Birth:  15-Dec-1975         BSA:          1.651 m Patient Age:    47 years          BP:           130/84 mmHg Patient Gender: F                 HR:           89 bpm. Exam Location:  Inpatient Procedure: 2D Echo Indications:    acute myocardial infarction  History:        Patient has no prior history of Echocardiogram examinations.                 Chronic kidney disease; Risk Factors:Hypertension, Dyslipidemia                 and Current Smoker.  Sonographer:    Delcie Roch RDCS Referring Phys: [phone] PETER M [place] IMPRESSIONS  1. Left ventricular ejection fraction, by estimation, is 50%. The left ventricle has low normal function. The left ventricle demonstrates regional wall motion abnormalities (apical akinesis without LV thrombus, consistent with LAD disease). Left ventricular diastolic parameters are consistent with Grade I diastolic dysfunction (impaired relaxation).  2. Right ventricular systolic function is normal. The right ventricular size is normal. Tricuspid regurgitation signal is inadequate for assessing PA pressure.  3. The mitral valve is grossly normal. Trivial mitral valve regurgitation. No evidence of mitral stenosis.  4. The aortic valve is tricuspid. Aortic valve regurgitation is not visualized. No aortic  stenosis is present. Comparison(s): No prior Echocardiogram. FINDINGS  Left Ventricle: Left ventricular ejection fraction, by estimation, is 50%. The left ventricle has low normal function.  The left ventricle demonstrates regional wall motion abnormalities. The left ventricular internal cavity size was normal in size. There is no left ventricular hypertrophy. Left ventricular diastolic parameters are consistent with Grade I diastolic dysfunction (impaired relaxation).  LV Wall Scoring: The apical septal segment, apical anterior segment, and apical inferior segment are akinetic. Right Ventricle: The right ventricular size is normal. No increase in right ventricular wall thickness. Right ventricular systolic function is normal. Tricuspid regurgitation signal is inadequate for assessing PA pressure. Left Atrium: Left atrial size was normal in size. Right Atrium: Right atrial size was normal in size. Pericardium: Trivial pericardial effusion is present. Mitral Valve: The mitral valve is grossly normal. Trivial mitral valve regurgitation. No evidence of mitral valve stenosis. Tricuspid Valve: The tricuspid valve is normal in structure. Tricuspid valve regurgitation is not demonstrated. No evidence of tricuspid stenosis. Aortic Valve: The aortic valve is tricuspid. Aortic valve regurgitation is not visualized. No aortic stenosis is present. Pulmonic Valve: The pulmonic valve was not well visualized. Pulmonic valve regurgitation is not visualized. No evidence of pulmonic stenosis. Aorta: The aortic root and ascending aorta are structurally normal, with no evidence of dilitation. IAS/Shunts: No atrial level shunt detected by color flow Doppler.  LEFT VENTRICLE PLAX 2D LVIDd:         4.00 cm     Diastology LVIDs:         2.90 cm     LV e' medial:    6.96 cm/s LV PW:         1.00 cm     LV E/e' medial:  13.6 LV IVS:        1.00 cm     LV e' lateral:   8.70 cm/s LVOT diam:     1.80 cm     LV E/e' lateral: 10.9 LV SV:         45 LV SV Index:   27 LVOT Area:     2.54 cm  LV Volumes (MOD) LV vol d, MOD A4C: 57.9 ml LV vol s, MOD A4C: 30.5 ml LV SV MOD A4C:     57.9 ml RIGHT VENTRICLE            IVC RV Basal diam:   2.50 cm    IVC diam: 1.70 cm RV S prime:     9.57 cm/s TAPSE (M-mode): 1.8 cm LEFT ATRIUM             Index        RIGHT ATRIUM           Index LA diam:        2.90 cm 1.76 cm/m   RA Area:     10.40 cm LA Vol (A2C):   27.0 ml 16.35 ml/m  RA Volume:   22.20 ml  13.44 ml/m LA Vol (A4C):   32.8 ml 19.86 ml/m LA Biplane Vol: 32.0 ml 19.38 ml/m  AORTIC VALVE LVOT Vmax:   85.20 cm/s LVOT Vmean:  57.700 cm/s LVOT VTI:    0.178 m  AORTA Ao Root diam: 2.80 cm Ao Asc diam:  2.90 cm MITRAL VALVE MV Area (PHT): 5.97 cm    SHUNTS MV Decel Time: 127 msec    Systemic VTI:  0.18 m MV E velocity: 94.80 cm/s  Systemic Diam: 1.80 cm MV A velocity: 99.30 cm/s MV  E/A ratio:  0.95 Riley Lam MD Electronically signed by Riley Lam MD Signature Date/Time: 10/14/2022/4:27:34 PM    Final    DG Chest Portable 1 View  Result Date: 10/14/2022 CLINICAL DATA:  Chest pain. EXAM: PORTABLE CHEST 1 VIEW COMPARISON:  None Available. FINDINGS: The cardiac silhouette, mediastinal hilar contours are normal. The lungs are clear an acute process. No pleural effusions. The bony thorax is intact. IMPRESSION: No acute cardiopulmonary findings. Electronically Signed   By: Rudie Meyer M.D.   On: 10/14/2022 15:55   CARDIAC CATHETERIZATION  Result Date: 10/14/2022   Prox LAD to Mid LAD lesion is 100% stenosed.   1st Diag lesion is 50% stenosed.   Ost Cx to Mid Cx lesion is 30% stenosed.   Prox RCA to Mid RCA lesion is 100% stenosed.   A drug-eluting stent was successfully placed using a SYNERGY XD 2.75X38.   Post intervention, there is a 0% residual stenosis.   LV end diastolic pressure is moderately elevated. 2 vessel occlusive CAD. CTO of the mid LAD. New occlusion of the proximal RCA Moderately elevated LVEDP Successful PCI of the proximal to mid RCA with DES x 1 Plan: check Echo. DAPT for at least one year.    Cardiac Studies   10/14/2022   Prox LAD to Mid LAD lesion is 100% stenosed.   1st Diag lesion is 50% stenosed.    Ost Cx to Mid Cx lesion is 30% stenosed.   Prox RCA to Mid RCA lesion is 100% stenosed.   A drug-eluting stent was successfully placed using a SYNERGY XD 2.75X38.   Post intervention, there is a 0% residual stenosis.   LV end diastolic pressure is moderately elevated.   2 vessel occlusive CAD. CTO of the mid LAD. New occlusion of the proximal RCA Moderately elevated LVEDP Successful PCI of the proximal to mid RCA with DES x 1   Plan: check Echo. DAPT for at least one year.    Intervention    ECHO: 10/14/2022 IMPRESSIONS  1. Left ventricular ejection fraction, by estimation, is 50%. The left ventricle has low normal function. The left ventricle demonstrates regional wall motion abnormalities (apical akinesis without LV thrombus, consistent with LAD disease). Left ventricular diastolic parameters are consistent with Grade I diastolic dysfunction (impaired relaxation).  2. Right ventricular systolic function is normal. The right ventricular size is normal. Tricuspid regurgitation signal is inadequate for assessing PA pressure.  3. The mitral valve is grossly normal. Trivial mitral valve regurgitation. No evidence of mitral stenosis.  4. The aortic valve is tricuspid. Aortic valve regurgitation is not visualized. No aortic stenosis is present.  Comparison(s): No prior Echocardiogram.       Patient Profile     47 y.o. female who has a history of CAD with late presenting MI, ischemic cardiomyopathy, HFrEF (35-40%), hypertension, CKD stage IIIa, DM type II, hyperlipidemia, hypothyroidism, CRPS who is being seen 10/14/2022 for the evaluation of STEMI.   Assessment & Plan    Inferior STEMI: Status post acute PCI to totally occluded RCA October 14, 2022.  Patient has known previously occluded LAD from previous late presentation MI in April 2023.  Continue DAPT with aspirin/Brilinta.  2D echo Doppler study shows EF 50% which has improved from her previous 35 to 40%. Ischemic  cardiomyopathy: Patient has previous LAD occlusion with nonviability for which medical therapy was recommended.  She was recently started on Entresto 24/26 twice daily and has been on metoprolol succinate.  Apparently she had an issue  with spironolactone as no longer on that.  EF yesterday was 50% with apical akinesis consistent with her LAD occlusion.  She underwent successful PCI to acute RCA occlusion.  Consider SGLT2 inhibition Mixed hyperlipidemia: Patient has been on atorvastatin 80 mg.  Will add Vascepa 2 capsules twice a day.  May be candidate for the clinical trial with Repatha.  Aim for LDL less than 55. History of tobacco use: Had smoked more consistently in the past.  Recently has been smoking occasional cigarette.  Discussed the importance of complete smoking cessation. Hypertension: She has been on metoprolol, Entresto prior to admission.  Target BP less than 130/80 Hypothyroidism: On levothyroxine 88 mcg. CKD: Stage IIIb   Possible transfer to cardiac telemetry later this afternoon; cardiac rehab to see.   For questions or updates, please contact [place] Please consult [email or web address] for contact info under        Signed, Shelva Majestic, MD  10/15/2022, 8:29 AM

## 2022-10-16 ENCOUNTER — Other Ambulatory Visit (HOSPITAL_COMMUNITY): Payer: Self-pay

## 2022-10-16 DIAGNOSIS — E782 Mixed hyperlipidemia: Secondary | ICD-10-CM | POA: Diagnosis not present

## 2022-10-16 DIAGNOSIS — R7303 Prediabetes: Secondary | ICD-10-CM | POA: Insufficient documentation

## 2022-10-16 DIAGNOSIS — I5022 Chronic systolic (congestive) heart failure: Secondary | ICD-10-CM | POA: Insufficient documentation

## 2022-10-16 DIAGNOSIS — I2111 ST elevation (STEMI) myocardial infarction involving right coronary artery: Secondary | ICD-10-CM | POA: Diagnosis not present

## 2022-10-16 DIAGNOSIS — I255 Ischemic cardiomyopathy: Secondary | ICD-10-CM | POA: Diagnosis not present

## 2022-10-16 DIAGNOSIS — I2510 Atherosclerotic heart disease of native coronary artery without angina pectoris: Secondary | ICD-10-CM | POA: Insufficient documentation

## 2022-10-16 LAB — COMPREHENSIVE METABOLIC PANEL
ALT: 24 U/L (ref 0–44)
AST: 61 U/L — ABNORMAL HIGH (ref 15–41)
Albumin: 3.5 g/dL (ref 3.5–5.0)
Alkaline Phosphatase: 96 U/L (ref 38–126)
Anion gap: 9 (ref 5–15)
BUN: 18 mg/dL (ref 6–20)
CO2: 22 mmol/L (ref 22–32)
Calcium: 8.5 mg/dL — ABNORMAL LOW (ref 8.9–10.3)
Chloride: 103 mmol/L (ref 98–111)
Creatinine, Ser: 1.42 mg/dL — ABNORMAL HIGH (ref 0.44–1.00)
GFR, Estimated: 46 mL/min — ABNORMAL LOW (ref >60)
Glucose, Bld: 136 mg/dL — ABNORMAL HIGH (ref 70–99)
Potassium: 4.1 mmol/L (ref 3.5–5.1)
Sodium: 134 mmol/L — ABNORMAL LOW (ref 135–145)
Total Bilirubin: 0.5 mg/dL (ref 0.3–1.2)
Total Protein: 7.1 g/dL (ref 6.5–8.1)

## 2022-10-16 LAB — HEMOGLOBIN A1C
Hgb A1c MFr Bld: 5.8 % — ABNORMAL HIGH (ref 4.8–5.6)
Mean Plasma Glucose: 120 mg/dL

## 2022-10-16 LAB — LIPOPROTEIN A (LPA): Lipoprotein (a): 141.4 nmol/L — ABNORMAL HIGH (ref <75.0)

## 2022-10-16 MED ORDER — METOPROLOL SUCCINATE ER 25 MG PO TB24
ORAL_TABLET | ORAL | 2 refills | Status: AC
  Filled 2022-10-16: qty 90, 30d supply, fill #0

## 2022-10-16 MED ORDER — OMEGA-3-ACID ETHYL ESTERS 1 G PO CAPS
2.0000 g | ORAL_CAPSULE | Freq: Two times a day (BID) | ORAL | 2 refills | Status: AC
  Filled 2022-10-16: qty 120, 30d supply, fill #0

## 2022-10-16 MED ORDER — ATORVASTATIN CALCIUM 80 MG PO TABS
80.0000 mg | ORAL_TABLET | Freq: Every day | ORAL | 5 refills | Status: AC
  Filled 2022-10-16: qty 30, 30d supply, fill #0

## 2022-10-16 NOTE — Progress Notes (Signed)
CARDIAC REHAB PHASE I   PRE:  Rate/Rhythm: 94 SR  BP:  Sitting: 107/80      SaO2: 96 RA  MODE:  Ambulation: 370 ft   POST:  Rate/Rhythm: 113 ST  BP:  Sitting: 102/72      SaO2: 98 RA  Pt ambulated in hall independently tolerating well with no CP, dizziness or SOB. Returned to bed with call bell and bedside table in reach. Reviewed education provided yesterday. All questions and concerns addressed. Plan for home later today.  [id number]  Dnya Barbara, RN BSN 10/16/2022 11:29 AM

## 2022-10-16 NOTE — Discharge Summary (Signed)
Discharge Summary    Patient ID: [name] [name] MRN: [id number]; DOB: 1975/12/12  Admit date: 10/14/2022 Discharge date: 10/16/2022  PCP:  Angelica Ran, MD   [facility] Providers Cardiologist:  [facility] (Dr. Maisie Fus)   Discharge Diagnoses    Principal Problem:   STEMI involving right coronary artery [facility]) Active Problems:   CAD (coronary artery disease)   Ischemic cardiomyopathy   Chronic HFrEF (heart failure with reduced ejection fraction) ([place])   Essential hypertension   Hyperlipidemia   Pre-diabetes   Post-surgical hypothyroidism   Tobacco abuse   Chronic kidney disease (CKD), stage III (moderate) (HCC)    Diagnostic Studies/Procedures    Left Cardiac Catheterization 10/14/2022:   Prox LAD to Mid LAD lesion is 100% stenosed.   1st Diag lesion is 50% stenosed.   Ost Cx to Mid Cx lesion is 30% stenosed.   Prox RCA to Mid RCA lesion is 100% stenosed.   A drug-eluting stent was successfully placed using a SYNERGY XD 2.75X38.   Post intervention, there is a 0% residual stenosis.   LV end diastolic pressure is moderately elevated.   2 vessel occlusive CAD. CTO of the mid LAD. New occlusion of the proximal RCA Moderately elevated LVEDP Successful PCI of the proximal to mid RCA with DES x 1   Plan: check Echo. DAPT for at least one year.   Diagnostic Dominance: Right  Intervention    _____________  Echocardiogram 10/14/2022: Impressions:  1. Left ventricular ejection fraction, by estimation, is 50%. The left  ventricle has low normal function. The left ventricle demonstrates  regional wall motion abnormalities (apical akinesis without LV thrombus,  consistent with LAD disease). Left  ventricular diastolic parameters are consistent with Grade I diastolic  dysfunction (impaired relaxation).   2. Right ventricular systolic function is normal. The right ventricular  size is normal. Tricuspid regurgitation signal is inadequate for  assessing  PA pressure.   3. The mitral valve is grossly normal. Trivial mitral valve  regurgitation. No evidence of mitral stenosis.   4. The aortic valve is tricuspid. Aortic valve regurgitation is not  visualized. No aortic stenosis is present.   Comparison(s): No prior Echocardiogram.    History of Present Illness     [name] [name] is a 47 y.o. female with a history of CAD with a late presenting MI in 01/2022 (found to have 100% occlusion of LAD which was treated medically), ischemic cardiomyopathy/ chronic systolic CHF with EF of 86-76% on Echo in 05/2022, hypertension, hyperlipidemia, type 2 diabetes mellitus, hypothyroidism, CKD stage IIIa, and complex regional pain syndrome followed by Neurology who is followed by Dr. Maisie Fus at [facility].  She has a history of a late presenting anterior MI in 01/2022. Cardiac catheterization at that time showed 100% stenosis of the mid LAD, long concentric stenosis of the RCA extending from the proximal to mid vessel with up to 70% narrowing at distal end, and 10-20% stenosis of LCx.  Tach MRI showed no area of viability in regards to the LAD therefore no PCI was performed.  Echo showed LVEF of 35-40% with anterior septal and anterior hypokinesis.  Was started on GDMT. Repeat Echo in 05/2022 showed LVEF of 35-40% with continued wall motion abnormalities as well as mild MR and trace TR.  Was last seen by [facility] cardiology on 09/15/2022 at which time she reported multiple side effects after starting spironolactone including dizziness, increased fatigue, and nausea/diarrhea.  Patient stopped the Spironolactone on her own and  symptoms improved  after several days. However, she also described a syncopal episode. This occured in setting of abdominal pain/ cramping. This was reportedly due to a cyst that had ruptured. No additional work-up of syncope was recommended. She was started on Entresto.  Patient presented to the [facility] ED on 10/14/2022 for  further evaluation of chest pain. She reported sudden onset of substernal chest pain that radiated to her back while out shopping that day with associated dyspnea and nausea. EKG showed ST elevations in inferior leads and ST depressions in lateral leads. Code STEMI was called and she was transferred to [facility] for emergent cardiac catheterization after being given Aspirin 324mg  and sublingual Nitro.   Hospital Course     Consultants: None   Inferior STEMI CAD Patient presented with sudden onset of chest pain and found to have inferior STEMI as above. High-sensitivity troponin 8 >> 3,819. Emergent cardiac catheterization showed 100% stenosis of proximal to mid RCA, known CTO of mid LAD, 50% stenosis of 1st Diag, and 30% stenosis of ostial to mid LCX. LVEDP was moderately elevated. She underwent successful PCI with DES to RCA lesion. Echo showed LVEF of 50% with apical akinesis (without LV thrombus) and grade 1 diastolic dysfunction with no significant valvular disease. She tolerated the procedure well and was continued on home DAPT with Aspirin 81mg  daily and Brilinta 90mg  twice daily. Toprol-XL was decreased due to soft BP but she was continued on home Imdur. Lipitor was increased.  Ischemic Cardiomyopathy Chronic Systolic CHF Echo this admission showed LVEF of 50% with apical akinesis (without LV thrombus) and grade 1 diastolic dysfunction with no significant valvular disease. EF improved from 35-40% in 05/2022. LVEDP was moderately elevated on cath and she was treated with one dose of IV Lasix. Continue PRN Lasix at home. Continue home Entresto 24-26mg  twice daily and Imdur 30mg  daily. Home Toprol-XL was decreased to 50mg  in the morning and 25mg  in the evening due to soft BP (previously on 50mg  twice daily at home). Previously unable to tolerate Spironolactone due to multiple side effects. Consider adding SGLT2 inhibitor as an outpatient.   Hypertension BP soft at times this admission but stable  and patient asymptomatic with it. Continue medications for CHF as above. Advised patient to monitor BP closely at home and notify primary Cardiologist if BP remains low or she is having symptoms of hypotension.  Hyperlipidemia Lipid panel this admission: Total Cholesterol 219, Triglycerides 394, HDL 30, LDL 110. LDL <55 given history of MI x2. Home Lipitor was increased to 80mg  daily. Initial plan was to add Vascepa but patient does not have insurance. Therefore, will start Lovaza 2g twice daily at discharge instead. Will need lipid panel and LFTs rechecked in 6-8 weeks.   Pre-Diabetes Hemoglobin A1c 5.8 this admission consistent with prediabetes. Follow-up with PCP.  Hypothyroidism Continue home Synthroid.  CKD Stage III Creatinine 1.7 on admission. Baseline around 1.4. Improved back to baseline by discharge.   Tobacco Abuse Patient was counseled on the importance of complete cessation during admission.  Patient seen and examined by Dr. today and determined to be stable for discharge. Patient follows with [facility]. Advised patient to call ASAP and schedule a follow-up appointment within 2 weeks. Medications as below.     Did the patient have an acute coronary syndrome (MI, NSTEMI, STEMI, etc) this admission?:  Yes  AHA/ACC Clinical Performance & Quality Measures: Aspirin prescribed? - Yes ADP Receptor Inhibitor (Plavix/Clopidogrel, Brilinta/Ticagrelor or Effient/Prasugrel) prescribed (includes medically managed patients)? - Yes Beta Blocker prescribed? - Yes High Intensity Statin (Lipitor 40-80mg  or Crestor 20-40mg ) prescribed? - Yes EF assessed during THIS hospitalization? - Yes For EF <40%, was ACEI/ARB prescribed? - Not Applicable (EF >/= 40%) For EF <40%, Aldosterone Antagonist (Spironolactone or Eplerenone) prescribed? - Not Applicable (EF >/= 98%) Cardiac Rehab Phase II ordered (including medically managed patients)? - Yes    _____________  Discharge Vitals Blood pressure 107/80, pulse 95, temperature 98.3 F (36.8 C), temperature source Oral, resp. rate 13, height 5' (1.524 m), weight 69.1 kg, SpO2 98 %.  Filed Weights   10/14/22 1236 10/15/22 0500  Weight: 68 kg 69.1 kg    Labs & Radiologic Studies    CBC Recent Labs    10/14/22 1240 10/14/22 1500 10/15/22 0309 10/15/22 0936  WBC 12.3*   < > 12.1* 13.8*  NEUTROABS 5.9  --   --   --   HGB 13.5   < > 13.6 15.0  HCT 39.6   < > 38.8 43.5  MCV 94.5   < > 92.4 92.6  PLT 304   < > 245 320   < > = values in this interval not displayed.   Basic Metabolic Panel Recent Labs    10/15/22 0309 10/16/22 0747  NA 134* 134*  K 4.3 4.1  CL 101 103  CO2 20* 22  GLUCOSE 132* 136*  BUN 18 18  CREATININE 1.52* 1.42*  CALCIUM 8.7* 8.5*  MG 2.1  --    Liver Function Tests Recent Labs    10/14/22 1240 10/16/22 0747  AST 14* 61*  ALT 8 24  ALKPHOS 110 96  BILITOT 0.4 0.5  PROT 7.9 7.1  ALBUMIN 4.3 3.5   No results for input(s): "LIPASE", "AMYLASE" in the last 72 hours. High Sensitivity Troponin:   Recent Labs  Lab 10/14/22 1240 10/14/22 1500  TROPONINIHS 8 3,819*    BNP Invalid input(s): "POCBNP" D-Dimer No results for input(s): "DDIMER" in the last 72 hours. Hemoglobin A1C Recent Labs    10/15/22 0309  HGBA1C 5.8*   Fasting Lipid Panel Recent Labs    10/15/22 0309  CHOL 219*  HDL 30*  LDLCALC 110*  TRIG 394*  CHOLHDL 7.3   Thyroid Function Tests No results for input(s): "TSH", "T4TOTAL", "T3FREE", "THYROIDAB" in the last 72 hours.  Invalid input(s): "FREET3" _____________  ECHOCARDIOGRAM COMPLETE  Result Date: 10/14/2022    ECHOCARDIOGRAM REPORT   Patient Name:   LATERA MCLIN Date of Exam: 10/14/2022 Medical Rec #:  [id number]         Height:       60.0 in Accession #:    [id number]        Weight:       149.9 lb Date of Birth:  [birth year]         BSA:          1.651 m Patient Age:    47 years          BP:            130/84 mmHg Patient Gender: F                 HR:           89 bpm. Exam Location:  Inpatient Procedure: 2D Echo Indications:    acute myocardial infarction  History:  Patient has no prior history of Echocardiogram examinations.                 Chronic kidney disease; Risk Factors:Hypertension, Dyslipidemia                 and Current Smoker.  Sonographer:    Delcie Roch RDCS Referring Phys: [id number] PETER M [place] IMPRESSIONS  1. Left ventricular ejection fraction, by estimation, is 50%. The left ventricle has low normal function. The left ventricle demonstrates regional wall motion abnormalities (apical akinesis without LV thrombus, consistent with LAD disease). Left ventricular diastolic parameters are consistent with Grade I diastolic dysfunction (impaired relaxation).  2. Right ventricular systolic function is normal. The right ventricular size is normal. Tricuspid regurgitation signal is inadequate for assessing PA pressure.  3. The mitral valve is grossly normal. Trivial mitral valve regurgitation. No evidence of mitral stenosis.  4. The aortic valve is tricuspid. Aortic valve regurgitation is not visualized. No aortic stenosis is present. Comparison(s): No prior Echocardiogram. FINDINGS  Left Ventricle: Left ventricular ejection fraction, by estimation, is 50%. The left ventricle has low normal function. The left ventricle demonstrates regional wall motion abnormalities. The left ventricular internal cavity size was normal in size. There is no left ventricular hypertrophy. Left ventricular diastolic parameters are consistent with Grade I diastolic dysfunction (impaired relaxation).  LV Wall Scoring: The apical septal segment, apical anterior segment, and apical inferior segment are akinetic. Right Ventricle: The right ventricular size is normal. No increase in right ventricular wall thickness. Right ventricular systolic function is normal. Tricuspid regurgitation signal is inadequate for assessing PA  pressure. Left Atrium: Left atrial size was normal in size. Right Atrium: Right atrial size was normal in size. Pericardium: Trivial pericardial effusion is present. Mitral Valve: The mitral valve is grossly normal. Trivial mitral valve regurgitation. No evidence of mitral valve stenosis. Tricuspid Valve: The tricuspid valve is normal in structure. Tricuspid valve regurgitation is not demonstrated. No evidence of tricuspid stenosis. Aortic Valve: The aortic valve is tricuspid. Aortic valve regurgitation is not visualized. No aortic stenosis is present. Pulmonic Valve: The pulmonic valve was not well visualized. Pulmonic valve regurgitation is not visualized. No evidence of pulmonic stenosis. Aorta: The aortic root and ascending aorta are structurally normal, with no evidence of dilitation. IAS/Shunts: No atrial level shunt detected by color flow Doppler.  LEFT VENTRICLE PLAX 2D LVIDd:         4.00 cm     Diastology LVIDs:         2.90 cm     LV e' medial:    6.96 cm/s LV PW:         1.00 cm     LV E/e' medial:  13.6 LV IVS:        1.00 cm     LV e' lateral:   8.70 cm/s LVOT diam:     1.80 cm     LV E/e' lateral: 10.9 LV SV:         45 LV SV Index:   27 LVOT Area:     2.54 cm  LV Volumes (MOD) LV vol d, MOD A4C: 57.9 ml LV vol s, MOD A4C: 30.5 ml LV SV MOD A4C:     57.9 ml RIGHT VENTRICLE            IVC RV Basal diam:  2.50 cm    IVC diam: 1.70 cm RV S prime:     9.57 cm/s TAPSE (M-mode): 1.8 cm LEFT ATRIUM  Index        RIGHT ATRIUM           Index LA diam:        2.90 cm 1.76 cm/m   RA Area:     10.40 cm LA Vol (A2C):   27.0 ml 16.35 ml/m  RA Volume:   22.20 ml  13.44 ml/m LA Vol (A4C):   32.8 ml 19.86 ml/m LA Biplane Vol: 32.0 ml 19.38 ml/m  AORTIC VALVE LVOT Vmax:   85.20 cm/s LVOT Vmean:  57.700 cm/s LVOT VTI:    0.178 m  AORTA Ao Root diam: 2.80 cm Ao Asc diam:  2.90 cm MITRAL VALVE MV Area (PHT): 5.97 cm    SHUNTS MV Decel Time: 127 msec    Systemic VTI:  0.18 m MV E velocity: 94.80 cm/s   Systemic Diam: 1.80 cm MV A velocity: 99.30 cm/s MV E/A ratio:  0.95 Riley Lam MD Electronically signed by Riley Lam MD Signature Date/Time: 10/14/2022/4:27:34 PM    Final    DG Chest Portable 1 View  Result Date: 10/14/2022 CLINICAL DATA:  Chest pain. EXAM: PORTABLE CHEST 1 VIEW COMPARISON:  None Available. FINDINGS: The cardiac silhouette, mediastinal hilar contours are normal. The lungs are clear an acute process. No pleural effusions. The bony thorax is intact. IMPRESSION: No acute cardiopulmonary findings. Electronically Signed   By: Rudie Meyer M.D.   On: 10/14/2022 15:55   CARDIAC CATHETERIZATION  Result Date: 10/14/2022   Prox LAD to Mid LAD lesion is 100% stenosed.   1st Diag lesion is 50% stenosed.   Ost Cx to Mid Cx lesion is 30% stenosed.   Prox RCA to Mid RCA lesion is 100% stenosed.   A drug-eluting stent was successfully placed using a SYNERGY XD 2.75X38.   Post intervention, there is a 0% residual stenosis.   LV end diastolic pressure is moderately elevated. 2 vessel occlusive CAD. CTO of the mid LAD. New occlusion of the proximal RCA Moderately elevated LVEDP Successful PCI of the proximal to mid RCA with DES x 1 Plan: check Echo. DAPT for at least one year.   Disposition   Patient is being discharged home today in good condition.  Follow-up Plans & Appointments     Follow-up Information     Cardiology. Call.   Why: Please call your primary Cardiologist's office as soon as possible to schedule a follow-up visit. Recommend follow-up visit within the next 2 weeks.        Primary Care Provider. Call.   Why: Please call and schedule a follow-up visit with your primary care provider in the next couple of weeks.               Discharge Instructions     Amb Referral to Cardiac Rehabilitation   Complete by: As directed    Diagnosis:  Coronary Stents STEMI     After initial evaluation and assessments completed: Virtual Based Care may be provided  alone or in conjunction with Phase 2 Cardiac Rehab based on patient barriers.: Yes   Intensive Cardiac Rehabilitation (ICR) [facility] location only OR Traditional Cardiac Rehabilitation (TCR) *If criteria for ICR are not met will enroll in TCR [facility] only): Yes   Diet - low sodium heart healthy   Complete by: As directed    Increase activity slowly   Complete by: As directed         Discharge Medications   Allergies as of 10/16/2022       Reactions  Gabapentin Other (See Comments)   Dizziness   Hydrochlorothiazide Other (See Comments)   Electrolyte abnormalities of hyponatremia and hypokalemia   Cortisone    Says her wrist up to elbow turned blue.  Has had some type of stroid in back and foot though without the same reaction   Pravastatin Other (See Comments)   Chest discomfort        Medication List     STOP taking these medications    spironolactone 25 MG tablet Commonly known as: ALDACTONE       TAKE these medications    aspirin 81 MG chewable tablet Chew 81 mg by mouth daily.   atorvastatin 80 MG tablet Commonly known as: LIPITOR Take 1 tablet (80 mg total) by mouth daily. Start taking on: October 17, 2022 What changed:  medication strength how much to take when to take this   bisacodyl 5 MG EC tablet Commonly known as: DULCOLAX Take 5-10 mg by mouth daily as needed for mild constipation or moderate constipation.   cyanocobalamin 500 MCG tablet Commonly known as: VITAMIN B12 Take 500 mcg by mouth daily.   cyclobenzaprine 10 MG tablet Commonly known as: FLEXERIL Take 10 mg by mouth every 12 (twelve) hours as needed.   Entresto 24-26 MG Generic drug: sacubitril-valsartan Take 1 tablet by mouth 2 (two) times daily.   folic acid 400 MCG tablet Commonly known as: FOLVITE Take 400 mcg by mouth daily.   furosemide 20 MG tablet Commonly known as: LASIX Take 20 mg by mouth daily as needed.   hydrocortisone 2.5 % cream Apply 1 Application topically 3  (three) times daily.   isosorbide mononitrate 30 MG 24 hr tablet Commonly known as: IMDUR Take 30 mg by mouth daily.   levothyroxine 88 MCG tablet Commonly known as: SYNTHROID Take 88 mcg by mouth daily.   lidocaine 2 % jelly Commonly known as: XYLOCAINE Apply 1 Application topically daily as needed (pain).   metoprolol succinate 25 MG 24 hr tablet Commonly known as: TOPROL-XL Take 2 tablets (50mg ) in the morning and 1 tablet (25mg ) in the evening. What changed:  how much to take how to take this when to take this additional instructions   nitroGLYCERIN 0.4 MG SL tablet Commonly known as: NITROSTAT Place 0.4 mg under the tongue every 5 (five) minutes as needed for chest pain.   omega-3 acid ethyl esters 1 g capsule Commonly known as: Lovaza Take 2 capsules (2 g total) by mouth 2 (two) times daily.   oxyCODONE-acetaminophen 10-325 MG tablet Commonly known as: PERCOCET Take 1 tablet by mouth every 4 (four) hours as needed for pain.   promethazine 12.5 MG tablet Commonly known as: PHENERGAN Take 12.5 mg by mouth every 6 (six) hours as needed for vomiting or nausea.   ticagrelor 90 MG Tabs tablet Commonly known as: BRILINTA Take 90 mg by mouth 2 (two) times daily.   Vitamin D (Ergocalciferol) 1.25 MG (50000 UNIT) Caps capsule Commonly known as: DRISDOL Take 50,000 Units by mouth every 7 (seven) days.           Outstanding Labs/Studies   Repeat lipid panel and LFTs in 6-8 weeks.  Duration of Discharge Encounter   Greater than 30 minutes including physician time.  Signed, Corrin Parkerallie E Erline Siddoway, PA-C 10/16/2022, 1:15 PM

## 2022-10-16 NOTE — TOC Initial Note (Signed)
Transition of Care [facility]) - Initial/Assessment Note    Patient Details  Name: [name] [name] MRN: [id number] Date of Birth: 30-Mar-1976  Transition of [facility]) CM/SW Contact:    Erenest Rasher, RN Phone Number: [phone] 10/16/2022, 1:34 PM  Clinical Narrative:                TOC CM discussed medications with pt and states she receives Entresto and Brilinta through the patient assistance program.She is able to pay out of pocket for other meds. She compares prices and uses goodrx. Explained the importance of purchasing her Medicare D in the future.   Expected Discharge Plan: Home/Self Care Barriers to Discharge: No Barriers Identified   Patient Goals and CMS Choice Patient states their goals for this hospitalization and ongoing recovery are:: wants  to go home CMS Medicare.gov Compare Post Acute Care list provided to:: Patient        Expected Discharge Plan and Services   Discharge Planning Services: CM Consult, Medication Assistance   Living arrangements for the past 2 months: Single Family Home Expected Discharge Date: 10/16/22                                    Prior Living Arrangements/Services Living arrangements for the past 2 months: Single Family Home Lives with:: Spouse Patient language and need for interpreter reviewed:: Yes Do you feel safe going back to the place where you live?: Yes      Need for Family Participation in Patient Care: No (Comment) Care giver support system in place?: Yes (comment)   Criminal Activity/Legal Involvement Pertinent to Current Situation/Hospitalization: No - Comment as needed  Activities of Daily Living Home Assistive Devices/Equipment: None ADL Screening (condition at time of admission) Patient's cognitive ability adequate to safely complete daily activities?: Yes Is the patient deaf or have difficulty hearing?: No Does the patient have difficulty seeing, even when wearing glasses/contacts?: No Does the  patient have difficulty concentrating, remembering, or making decisions?: No Patient able to express need for assistance with ADLs?: Yes Does the patient have difficulty dressing or bathing?: No Independently performs ADLs?: Yes (appropriate for developmental age) Does the patient have difficulty walking or climbing stairs?: No Weakness of Legs: None Weakness of Arms/Hands: None  Permission Sought/Granted Permission sought to share information with : Case Manager, Family Supports, PCP Permission granted to share information with : Yes, Verbal Permission Granted  Share Information with NAME: Raelynn Corron     Permission granted to share info w Relationship: husband  Permission granted to share info w Contact Information: [phone]  Emotional Assessment Appearance:: Appears stated age Attitude/Demeanor/Rapport: Engaged Affect (typically observed): Accepting Orientation: : Oriented to Self, Oriented to Place, Oriented to  Time, Oriented to Situation   Psych Involvement: No (comment)  Admission diagnosis:  STEMI (ST elevation myocardial infarction) ([place]) [I21.3] ST elevation myocardial infarction (STEMI), unspecified artery ([place]) [I21.3] STEMI involving right coronary artery ([place]) [I21.11] Patient Active Problem List   Diagnosis Date Noted   CAD (coronary artery disease) 10/16/2022   Ischemic cardiomyopathy 10/16/2022   Chronic HFrEF (heart failure with reduced ejection fraction) ([place]) 10/16/2022   Pre-diabetes 10/16/2022   STEMI (ST elevation myocardial infarction) ([place]) 10/14/2022   STEMI involving right coronary artery ([place]) 10/14/2022   Chronic kidney disease (CKD), stage III (moderate) ([place]) 12/13/2019   Cellulitis of hand 12/29/2018   Erythrocytosis 12/29/2018   Hyperlipidemia 12/29/2018  Panic attacks 12/29/2018   Recurrent depression ([place]) 12/29/2018   Sleep disorder 12/29/2018   Urine incontinence 12/29/2018   Vitamin D deficiency 12/29/2018   Tobacco abuse  07/21/2018   Trigger finger of right thumb 05/12/2017   Lateral epicondylitis of both elbows 02/17/2017   Pain 02/17/2017   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   Essential hypertension 12/09/2015   Anxiety 12/06/2015   Complex regional pain syndrome I of upper limb 03/29/2014   Lumbar back pain with radiculopathy affecting left lower extremity 03/19/2014   Baker's cyst of knee 05/18/2013   Carpal tunnel syndrome of right wrist 05/18/2013   Left ankle pain 02/02/2013   Constipation, chronic 07/29/2012   Post-surgical hypothyroidism 04/28/2012   PCP:  Angelica Ran, MD Pharmacy:   [place], [place] - [address] [place] [place] [address] Phone: [phone] Fax: [place] [address] [place] [address] Phone: [phone] Fax: [phone]     Social Determinants of Health (SDOH) Social History: SDOH Screenings   Food Insecurity: No Food Insecurity (10/14/2022)  Housing: Low Risk  (10/14/2022)  Transportation Needs: Unmet Transportation Needs (10/14/2022)  Utilities: Not At Risk (10/14/2022)  Tobacco Use: High Risk (10/15/2022)   SDOH Interventions:     Readmission Risk Interventions     No data to display

## 2022-10-16 NOTE — Discharge Instructions (Signed)
Medication Changes: - CONTINUE Aspirin and Brilinta. These medications very important in helping keep the new stent in your heart open. - INCREASE Atorvastatin (Lipitor) to 80mg  daily and START Lovaza 2g twice daily. This is for your cholesterol. - DECREASE Metoprolol succinate (Toprol-XL) to 50mg  in the morning and 25mg  in the evening.  Continue all other home medications as described elsewhere on discharge paperwork.  Post STEMI: NO HEAVY LIFTING X 4 WEEKS. NO SEXUAL ACTIVITY X 4 WEEKS. NO DRIVING X 2 WEEKS. NO SOAKING BATHS, HOT TUBS, POOLS, ETC., X 7 DAYS.  Radial Site Care: Refer to this sheet in the next few weeks. These instructions provide you with information on caring for yourself after your procedure. Your caregiver may also give you more specific instructions. Your treatment has been planned according to current medical practices, but problems sometimes occur. Call your caregiver if you have any problems or questions after your procedure. HOME CARE INSTRUCTIONS You may shower the day after the procedure. Remove the bandage (dressing) and gently wash the site with plain soap and water. Gently pat the site dry.  Do not apply powder or lotion to the site.  Do not submerge the affected site in water for 3 to 5 days.  Inspect the site at least twice daily.  Do not flex or bend the affected arm for 24 hours.  No lifting over 5 pounds (2.3 kg) for 5 days after your procedure.  Do not drive home if you are discharged the same day of the procedure. Have someone else drive you.  What to expect: Any bruising will usually fade within 1 to 2 weeks.  Blood that collects in the tissue (hematoma) may be painful to the touch. It should usually decrease in size and tenderness within 1 to 2 weeks.  SEEK IMMEDIATE MEDICAL CARE IF: You have unusual pain at the radial site.  You have redness, warmth, swelling, or pain at the radial site.  You have drainage (other than a small amount of blood on  the dressing).  You have chills.  You have a fever or persistent symptoms for more than 72 hours.  You have a fever and your symptoms suddenly get worse.  Your arm becomes pale, cool, tingly, or numb.  You have heavy bleeding from the site. Hold pressure on the site.

## 2022-10-16 NOTE — Progress Notes (Addendum)
Rounding Note    Patient Name: [name] [name] Date of Encounter: 10/16/2022  [place] Cardiologist: Dr. Maisie Fus at [facility] cardiology  Subjective   No chest  pain; has chronic back pain  Inpatient Medications    Scheduled Meds:  aspirin  81 mg Oral Daily   atorvastatin  80 mg Oral Daily   Chlorhexidine Gluconate Cloth  6 each Topical Q0600   enoxaparin (LOVENOX) injection  40 mg Subcutaneous Q24H   escitalopram  10 mg Oral Daily   folic acid  0.5 mg Oral Daily   isosorbide mononitrate  30 mg Oral Daily   levothyroxine  88 mcg Oral Daily   metoprolol succinate  50 mg Oral Daily   sacubitril-valsartan  1 tablet Oral BID   sodium chloride flush  3 mL Intravenous Q12H   ticagrelor  90 mg Oral BID   Continuous Infusions:  sodium chloride Stopped (10/15/22 1140)   PRN Meds: sodium chloride, acetaminophen, nitroGLYCERIN, ondansetron (ZOFRAN) IV, oxyCODONE-acetaminophen **AND** oxyCODONE, sodium chloride flush   Vital Signs    Vitals:   10/16/22 0600 10/16/22 0700 10/16/22 0800 10/16/22 0830  BP: 93/67  94/60   Pulse: 76 82 90 87  Resp: 11 15 (!) 28 14  Temp:  98.3 F (36.8 C)    TempSrc:  Oral    SpO2: 97% 98% 98% 98%  Weight:      Height:        Intake/Output Summary (Last 24 hours) at 10/16/2022 0834 Last data filed at 10/16/2022 0600 Gross per 24 hour  Intake 236.72 ml  Output --  Net 236.72 ml      10/15/2022    5:00 AM 10/14/2022   12:36 PM 04/25/2021   12:15 AM  Last 3 Weights  Weight (lbs) 152 lb 5.4 oz 149 lb 14.6 oz 150 lb  Weight (kg) 69.1 kg 68 kg 68.04 kg      Telemetry    Sinus rhythm at 96 - Personally Reviewed  ECG    10/16/2022: ECG (independently read by me): NSR at 79, RBBBl T wave abnormality inferolaterally  10/15/2021 ECG (independently read by me): Normal sinus rhythm at 86, right bundle branch block.  Small atrial septal Q wave V1 through V3.  Small inferior Q waves- Personally Reviewed  Physical Exam    BP 94/60  (BP Location: Left Arm)   Pulse 87   Temp 98.3 F (36.8 C) (Oral)   Resp 14   Ht 5' (1.524 m)   Wt 69.1 kg   SpO2 98%   BMI 29.75 kg/m  General: Alert, oriented, no distress.  Skin: normal turgor, no rashes, warm and dry HEENT: Normocephalic, atraumatic. Pupils equal round and reactive to light; sclera anicteric; extraocular muscles intact;  Nose without nasal septal hypertrophy Mouth/Parynx benign; Mallinpatti scale 3    Neck: No JVD, no carotid bruits; normal carotid upstroke Lungs: clear to ausculatation and percussion; no wheezing or rales Chest wall: without tenderness to palpitation Heart: PMI not displaced, RRR, s1 s2 normal, 1/6 systolic murmur, no diastolic murmur, no rubs, gallops, thrills, or heaves Abdomen: soft, nontender; no hepatosplenomehaly, BS+; abdominal aorta nontender and not dilated by palpation. Back: no CVA tenderness Pulses 2+ Musculoskeletal: full range of motion, normal strength, no joint deformities Extremities: no clubbing cyanosis or edema, Homan's sign negative  Neurologic: grossly nonfocal; Cranial nerves grossly wnl Psychologic: Normal mood and affect     Labs    High Sensitivity Troponin:   Recent Labs  Lab 10/14/22  1240 10/14/22 1500  TROPONINIHS 8 3,819*     Chemistry Recent Labs  Lab 10/14/22 1240 10/14/22 1500 10/15/22 0309  NA 134*  --  134*  K 3.2*  --  4.3  CL 100  --  101  CO2 19*  --  20*  GLUCOSE 150*  --  132*  BUN 20  --  18  CREATININE 1.70* 1.65* 1.52*  CALCIUM 9.0  --  8.7*  MG  --   --  2.1  PROT 7.9  --   --   ALBUMIN 4.3  --   --   AST 14*  --   --   ALT 8  --   --   ALKPHOS 110  --   --   BILITOT 0.4  --   --   GFRNONAA 37* 38* 42*  ANIONGAP 15  --  13    Lipids  Recent Labs  Lab 10/15/22 0309  CHOL 219*  TRIG 394*  HDL 30*  LDLCALC 110*  CHOLHDL 7.3    Hematology Recent Labs  Lab 10/14/22 1500 10/15/22 0309 10/15/22 0936  WBC 15.2* 12.1* 13.8*  RBC 4.14 4.20 4.70  HGB 13.5 13.6 15.0   HCT 39.2 38.8 43.5  MCV 94.7 92.4 92.6  MCH 32.6 32.4 31.9  MCHC 34.4 35.1 34.5  RDW 14.2 14.1 14.2  PLT 249 245 320   Thyroid No results for input(s): "TSH", "FREET4" in the last 168 hours.  BNPNo results for input(s): "BNP", "PROBNP" in the last 168 hours.  DDimer No results for input(s): "DDIMER" in the last 168 hours.   Radiology    ECHOCARDIOGRAM COMPLETE  Result Date: 10/14/2022    ECHOCARDIOGRAM REPORT   Patient Name:   [name] [name] Date of Exam: 10/14/2022 Medical Rec #:  [id number]         Height:       60.0 in Accession #:    [id number]        Weight:       149.9 lb Date of Birth:  [birth year]         BSA:          1.651 m Patient Age:    47 years          BP:           130/84 mmHg Patient Gender: F                 HR:           89 bpm. Exam Location:  Inpatient Procedure: 2D Echo Indications:    acute myocardial infarction  History:        Patient has no prior history of Echocardiogram examinations.                 Chronic kidney disease; Risk Factors:Hypertension, Dyslipidemia                 and Current Smoker.  Sonographer:    Johny Chess RDCS Referring Phys: [id number] PETER M [place] IMPRESSIONS  1. Left ventricular ejection fraction, by estimation, is 50%. The left ventricle has low normal function. The left ventricle demonstrates regional wall motion abnormalities (apical akinesis without LV thrombus, consistent with LAD disease). Left ventricular diastolic parameters are consistent with Grade I diastolic dysfunction (impaired relaxation).  2. Right ventricular systolic function is normal. The right ventricular size is normal. Tricuspid regurgitation signal is inadequate for assessing PA pressure.  3. The mitral valve is grossly normal.  Trivial mitral valve regurgitation. No evidence of mitral stenosis.  4. The aortic valve is tricuspid. Aortic valve regurgitation is not visualized. No aortic stenosis is present. Comparison(s): No prior Echocardiogram. FINDINGS  Left Ventricle: Left  ventricular ejection fraction, by estimation, is 50%. The left ventricle has low normal function. The left ventricle demonstrates regional wall motion abnormalities. The left ventricular internal cavity size was normal in size. There is no left ventricular hypertrophy. Left ventricular diastolic parameters are consistent with Grade I diastolic dysfunction (impaired relaxation).  LV Wall Scoring: The apical septal segment, apical anterior segment, and apical inferior segment are akinetic. Right Ventricle: The right ventricular size is normal. No increase in right ventricular wall thickness. Right ventricular systolic function is normal. Tricuspid regurgitation signal is inadequate for assessing PA pressure. Left Atrium: Left atrial size was normal in size. Right Atrium: Right atrial size was normal in size. Pericardium: Trivial pericardial effusion is present. Mitral Valve: The mitral valve is grossly normal. Trivial mitral valve regurgitation. No evidence of mitral valve stenosis. Tricuspid Valve: The tricuspid valve is normal in structure. Tricuspid valve regurgitation is not demonstrated. No evidence of tricuspid stenosis. Aortic Valve: The aortic valve is tricuspid. Aortic valve regurgitation is not visualized. No aortic stenosis is present. Pulmonic Valve: The pulmonic valve was not well visualized. Pulmonic valve regurgitation is not visualized. No evidence of pulmonic stenosis. Aorta: The aortic root and ascending aorta are structurally normal, with no evidence of dilitation. IAS/Shunts: No atrial level shunt detected by color flow Doppler.  LEFT VENTRICLE PLAX 2D LVIDd:         4.00 cm     Diastology LVIDs:         2.90 cm     LV e' medial:    6.96 cm/s LV PW:         1.00 cm     LV E/e' medial:  13.6 LV IVS:        1.00 cm     LV e' lateral:   8.70 cm/s LVOT diam:     1.80 cm     LV E/e' lateral: 10.9 LV SV:         45 LV SV Index:   27 LVOT Area:     2.54 cm  LV Volumes (MOD) LV vol d, MOD A4C: 57.9 ml LV  vol s, MOD A4C: 30.5 ml LV SV MOD A4C:     57.9 ml RIGHT VENTRICLE            IVC RV Basal diam:  2.50 cm    IVC diam: 1.70 cm RV S prime:     9.57 cm/s TAPSE (M-mode): 1.8 cm LEFT ATRIUM             Index        RIGHT ATRIUM           Index LA diam:        2.90 cm 1.76 cm/m   RA Area:     10.40 cm LA Vol (A2C):   27.0 ml 16.35 ml/m  RA Volume:   22.20 ml  13.44 ml/m LA Vol (A4C):   32.8 ml 19.86 ml/m LA Biplane Vol: 32.0 ml 19.38 ml/m  AORTIC VALVE LVOT Vmax:   85.20 cm/s LVOT Vmean:  57.700 cm/s LVOT VTI:    0.178 m  AORTA Ao Root diam: 2.80 cm Ao Asc diam:  2.90 cm MITRAL VALVE MV Area (PHT): 5.97 cm    SHUNTS MV Decel Time: 127 msec  Systemic VTI:  0.18 m MV E velocity: 94.80 cm/s  Systemic Diam: 1.80 cm MV A velocity: 99.30 cm/s MV E/A ratio:  0.95 Rudean Haskell MD Electronically signed by Rudean Haskell MD Signature Date/Time: 10/14/2022/4:27:34 PM    Final    DG Chest Portable 1 View  Result Date: 10/14/2022 CLINICAL DATA:  Chest pain. EXAM: PORTABLE CHEST 1 VIEW COMPARISON:  None Available. FINDINGS: The cardiac silhouette, mediastinal hilar contours are normal. The lungs are clear an acute process. No pleural effusions. The bony thorax is intact. IMPRESSION: No acute cardiopulmonary findings. Electronically Signed   By: Marijo Sanes M.D.   On: 10/14/2022 15:55   CARDIAC CATHETERIZATION  Result Date: 10/14/2022   Prox LAD to Mid LAD lesion is 100% stenosed.   1st Diag lesion is 50% stenosed.   Ost Cx to Mid Cx lesion is 30% stenosed.   Prox RCA to Mid RCA lesion is 100% stenosed.   A drug-eluting stent was successfully placed using a SYNERGY XD 2.75X38.   Post intervention, there is a 0% residual stenosis.   LV end diastolic pressure is moderately elevated. 2 vessel occlusive CAD. CTO of the mid LAD. New occlusion of the proximal RCA Moderately elevated LVEDP Successful PCI of the proximal to mid RCA with DES x 1 Plan: check Echo. DAPT for at least one year.    Cardiac Studies    10/14/2022   Prox LAD to Mid LAD lesion is 100% stenosed.   1st Diag lesion is 50% stenosed.   Ost Cx to Mid Cx lesion is 30% stenosed.   Prox RCA to Mid RCA lesion is 100% stenosed.   A drug-eluting stent was successfully placed using a SYNERGY XD 2.75X38.   Post intervention, there is a 0% residual stenosis.   LV end diastolic pressure is moderately elevated.   2 vessel occlusive CAD. CTO of the mid LAD. New occlusion of the proximal RCA Moderately elevated LVEDP Successful PCI of the proximal to mid RCA with DES x 1   Plan: check Echo. DAPT for at least one year.    Intervention    ECHO: 10/14/2022 IMPRESSIONS  1. Left ventricular ejection fraction, by estimation, is 50%. The left ventricle has low normal function. The left ventricle demonstrates regional wall motion abnormalities (apical akinesis without LV thrombus, consistent with LAD disease). Left ventricular diastolic parameters are consistent with Grade I diastolic dysfunction (impaired relaxation).  2. Right ventricular systolic function is normal. The right ventricular size is normal. Tricuspid regurgitation signal is inadequate for assessing PA pressure.  3. The mitral valve is grossly normal. Trivial mitral valve regurgitation. No evidence of mitral stenosis.  4. The aortic valve is tricuspid. Aortic valve regurgitation is not visualized. No aortic stenosis is present.  Comparison(s): No prior Echocardiogram.       Patient Profile     47 y.o. female who has a history of CAD with late presenting MI, ischemic cardiomyopathy, HFrEF (35-40%), hypertension, CKD stage IIIa, DM type II, hyperlipidemia, hypothyroidism, CRPS who is being seen 10/14/2022 for the evaluation of STEMI.   Assessment & Plan    Inferior STEMI: Status post acute PCI to totally occluded RCA October 14, 2022.  Patient has known previously occluded LAD from previous late presentation MI in April 2023.  Continue DAPT with aspirin/Brilinta.  2D  echo Doppler study shows EF 50% which has improved from her previous 35 to 40%.  She has not had any recurrent chest pain or shortness of breath.  She was  unable to be transferred yesterday to cardiac telemetry due to bed availability.  Blood pressure 107/80' MAP 90.  Plan to ambulate this morning, adjust metoprolol succinate from 50 mg in the morning to 50 mg in the morning and 25 mg at night.  (She was on 50 twice daily at home but with reduced BP will initially change to 50 a.m./20 5 PM) .plan for discharge this afternoon from [place], with continued follow-up with her primary cardiologist Dr. Maisie Fus Ischemic cardiomyopathy: Patient has previous LAD occlusion with nonviability for which medical therapy was recommended.  She was recently started on Entresto 24/26 twice daily and has been on metoprolol succinate.  Apparently she had an issue with spironolactone as no longer on that.  EF on January 12/2022 post attic of RCA was 50% with apical akinesis consistent with her LAD occlusion.  She underwent successful PCI to acute RCA occlusion.  Consider SGLT2 inhibition possibly as outpatient Mixed hyperlipidemia: Patient has been on atorvastatin 80 mg.  Will add Vascepa 2 capsules twice a day at discharge.  May be candidate for the clinical trial with Repatha.  Aim for LDL less than 55. History of tobacco use: Had smoked more consistently in the past.  Recently has been smoking occasional cigarette.  Discussed the importance of complete smoking cessation. Hypertension: She has been on metoprolol, Entresto prior to admission.  Target BP less than 130/80 Hypothyroidism: On levothyroxine 88 mcg. CKD: Stage IIIb   Plan discharge this afternoon.  For questions or updates, please contact [place] Please consult [email or web address] for contact info under        Signed, Shelva Majestic, MD  10/16/2022, 8:34 AM

## 2022-10-22 MED FILL — Fentanyl Citrate Preservative Free (PF) Inj 100 MCG/2ML: INTRAMUSCULAR | Qty: 1 | Status: AC

## 2022-11-05 ENCOUNTER — Emergency Department (HOSPITAL_COMMUNITY): Payer: Medicare Other

## 2022-11-05 ENCOUNTER — Emergency Department (HOSPITAL_COMMUNITY)
Admission: EM | Admit: 2022-11-05 | Discharge: 2022-11-05 | Discharge status: Against Medical Advice | Payer: Medicare Other | Attending: Emergency Medicine | Admitting: Emergency Medicine

## 2022-11-05 DIAGNOSIS — Z5321 Procedure and treatment not carried out due to patient leaving prior to being seen by health care provider: Secondary | ICD-10-CM | POA: Insufficient documentation

## 2022-11-05 DIAGNOSIS — Z955 Presence of coronary angioplasty implant and graft: Secondary | ICD-10-CM | POA: Diagnosis not present

## 2022-11-05 DIAGNOSIS — R0789 Other chest pain: Secondary | ICD-10-CM

## 2022-11-05 DIAGNOSIS — I519 Heart disease, unspecified: Secondary | ICD-10-CM | POA: Diagnosis not present

## 2022-11-05 DIAGNOSIS — R079 Chest pain, unspecified: Secondary | ICD-10-CM | POA: Diagnosis present

## 2022-11-05 DIAGNOSIS — Z79899 Other long term (current) drug therapy: Secondary | ICD-10-CM | POA: Diagnosis not present

## 2022-11-05 DIAGNOSIS — I4510 Unspecified right bundle-branch block: Secondary | ICD-10-CM | POA: Insufficient documentation

## 2022-11-05 LAB — COMPREHENSIVE METABOLIC PANEL
ALT: 13 U/L (ref 0–44)
AST: 21 U/L (ref 15–41)
Albumin: 4.1 g/dL (ref 3.5–5.0)
Alkaline Phosphatase: 120 U/L (ref 38–126)
Anion gap: 15 (ref 5–15)
BUN: 19 mg/dL (ref 6–20)
CO2: 20 mmol/L — ABNORMAL LOW (ref 22–32)
Calcium: 8.4 mg/dL — ABNORMAL LOW (ref 8.9–10.3)
Chloride: 98 mmol/L (ref 98–111)
Creatinine, Ser: 2.11 mg/dL — ABNORMAL HIGH (ref 0.44–1.00)
GFR, Estimated: 28 mL/min — ABNORMAL LOW (ref >60)
Glucose, Bld: 138 mg/dL — ABNORMAL HIGH (ref 70–99)
Potassium: 3.3 mmol/L — ABNORMAL LOW (ref 3.5–5.1)
Sodium: 133 mmol/L — ABNORMAL LOW (ref 135–145)
Total Bilirubin: 0.8 mg/dL (ref 0.3–1.2)
Total Protein: 7.7 g/dL (ref 6.5–8.1)

## 2022-11-05 LAB — CBC
HCT: 41.5 % (ref 36.0–46.0)
Hemoglobin: 14.4 g/dL (ref 12.0–15.0)
MCH: 32.8 pg (ref 26.0–34.0)
MCHC: 34.7 g/dL (ref 30.0–36.0)
MCV: 94.5 fL (ref 80.0–100.0)
Platelets: 248 10*3/uL (ref 150–400)
RBC: 4.39 MIL/uL (ref 3.87–5.11)
RDW: 14 % (ref 11.5–15.5)
WBC: 9.7 10*3/uL (ref 4.0–10.5)
nRBC: 0 % (ref 0.0–0.2)

## 2022-11-05 LAB — LIPASE, BLOOD: Lipase: 49 U/L (ref 11–51)

## 2022-11-05 LAB — BRAIN NATRIURETIC PEPTIDE: B Natriuretic Peptide: 90.6 pg/mL (ref 0.0–100.0)

## 2022-11-05 LAB — ETHANOL: Alcohol, Ethyl (B): <10 mg/dL (ref <10)

## 2022-11-05 LAB — TROPONIN I (HIGH SENSITIVITY): Troponin I (High Sensitivity): 26 ng/L — ABNORMAL HIGH (ref <18)

## 2022-11-05 NOTE — ED Triage Notes (Addendum)
Pt. Stated, I started having indigestion this past weekend and then again yesterday and my heart rate keeps climbing. The last time I had a heart attack . It makes me so scared. I had a stent in January.

## 2022-11-05 NOTE — ED Provider Triage Note (Signed)
Emergency Medicine Provider Triage Evaluation Note  [name] [name] , a 48 y.o. female  was evaluated in triage.  Pt complains of chest pain.  History is notable for stent placed earlier this month, prior cardiac procedures well.  She states that she has been compliant with her medication.  But over the past few days has felt vague, difficult to describe chest discomfort, and her blood pressure and heart rate have both been elevated in spite of taking her medication.  Currently she states that she feels fine.  Review of Systems  Positive: Per HPI Negative: Fever  Physical Exam  BP 122/86 (BP Location: Right Arm)   Pulse 95   Temp 97.8 F (36.6 C) (Oral)   Resp 16   SpO2 100%  Gen:   Awake, no distress speaking clearly Resp:  Normal effort no increased work of breathing MSK:   Moves extremities without difficulty no deformity Other:  Neuro unremarkable  Medical Decision Making  Medically screening exam initiated at 8:44 AM.  Appropriate orders placed.  [name] [name] was informed that the remainder of the evaluation will be completed by another provider, this initial triage assessment does not replace that evaluation, and the importance of remaining in the ED until their evaluation is complete.   Carmin Muskrat, MD 11/05/22 [phone]

## 2022-11-05 NOTE — ED Notes (Signed)
Patient called for vitals, no response x3

## 2022-11-05 NOTE — ED Notes (Signed)
Patient LWBS

## 2023-02-05 ENCOUNTER — Emergency Department (HOSPITAL_COMMUNITY)
Admission: EM | Admit: 2023-02-05 | Discharge: 2023-02-05 | Disposition: A | Discharge status: Home / Self Care | Payer: Medicare Other | Attending: Emergency Medicine | Admitting: Emergency Medicine

## 2023-02-05 ENCOUNTER — Encounter (HOSPITAL_COMMUNITY): Payer: Self-pay

## 2023-02-05 ENCOUNTER — Other Ambulatory Visit: Payer: Self-pay

## 2023-02-05 DIAGNOSIS — I2510 Atherosclerotic heart disease of native coronary artery without angina pectoris: Secondary | ICD-10-CM | POA: Insufficient documentation

## 2023-02-05 DIAGNOSIS — K047 Periapical abscess without sinus: Secondary | ICD-10-CM | POA: Insufficient documentation

## 2023-02-05 DIAGNOSIS — R000 Tachycardia, unspecified: Secondary | ICD-10-CM | POA: Insufficient documentation

## 2023-02-05 DIAGNOSIS — N189 Chronic kidney disease, unspecified: Secondary | ICD-10-CM | POA: Insufficient documentation

## 2023-02-05 DIAGNOSIS — I119 Hypertensive heart disease without heart failure: Secondary | ICD-10-CM | POA: Insufficient documentation

## 2023-02-05 DIAGNOSIS — E039 Hypothyroidism, unspecified: Secondary | ICD-10-CM | POA: Diagnosis not present

## 2023-02-05 LAB — CBC WITH DIFFERENTIAL/PLATELET
Abs Immature Granulocytes: 0.06 10*3/uL (ref 0.00–0.07)
Basophils Absolute: 0 10*3/uL (ref 0.0–0.1)
Basophils Relative: 1 %
Eosinophils Absolute: 0.2 10*3/uL (ref 0.0–0.5)
Eosinophils Relative: 3 %
HCT: 38.9 % (ref 36.0–46.0)
Hemoglobin: 13.1 g/dL (ref 12.0–15.0)
Immature Granulocytes: 1 %
Lymphocytes Relative: 23 %
Lymphs Abs: 2 10*3/uL (ref 0.7–4.0)
MCH: 31.9 pg (ref 26.0–34.0)
MCHC: 33.7 g/dL (ref 30.0–36.0)
MCV: 94.6 fL (ref 80.0–100.0)
Monocytes Absolute: 0.7 10*3/uL (ref 0.1–1.0)
Monocytes Relative: 8 %
Neutro Abs: 5.8 10*3/uL (ref 1.7–7.7)
Neutrophils Relative %: 64 %
Platelets: 266 10*3/uL (ref 150–400)
RBC: 4.11 MIL/uL (ref 3.87–5.11)
RDW: 14.2 % (ref 11.5–15.5)
WBC: 8.8 10*3/uL (ref 4.0–10.5)
nRBC: 0 % (ref 0.0–0.2)

## 2023-02-05 LAB — COMPREHENSIVE METABOLIC PANEL
ALT: 10 U/L (ref 0–44)
AST: 15 U/L (ref 15–41)
Albumin: 3.4 g/dL — ABNORMAL LOW (ref 3.5–5.0)
Alkaline Phosphatase: 108 U/L (ref 38–126)
Anion gap: 11 (ref 5–15)
BUN: 9 mg/dL (ref 6–20)
CO2: 24 mmol/L (ref 22–32)
Calcium: 8.4 mg/dL — ABNORMAL LOW (ref 8.9–10.3)
Chloride: 97 mmol/L — ABNORMAL LOW (ref 98–111)
Creatinine, Ser: 1.46 mg/dL — ABNORMAL HIGH (ref 0.44–1.00)
GFR, Estimated: 44 mL/min — ABNORMAL LOW (ref >60)
Glucose, Bld: 135 mg/dL — ABNORMAL HIGH (ref 70–99)
Potassium: 3.7 mmol/L (ref 3.5–5.1)
Sodium: 132 mmol/L — ABNORMAL LOW (ref 135–145)
Total Bilirubin: 0.6 mg/dL (ref 0.3–1.2)
Total Protein: 7.2 g/dL (ref 6.5–8.1)

## 2023-02-05 LAB — URINALYSIS, ROUTINE W REFLEX MICROSCOPIC
Bacteria, UA: NONE SEEN
Bilirubin Urine: NEGATIVE
Glucose, UA: NEGATIVE mg/dL
Ketones, ur: NEGATIVE mg/dL
Leukocytes,Ua: NEGATIVE
Nitrite: NEGATIVE
Protein, ur: NEGATIVE mg/dL
Specific Gravity, Urine: 1.006 (ref 1.005–1.030)
pH: 5 (ref 5.0–8.0)

## 2023-02-05 LAB — LACTIC ACID, PLASMA: Lactic Acid, Venous: 1 mmol/L (ref 0.5–1.9)

## 2023-02-05 MED ORDER — ACETAMINOPHEN 500 MG PO TABS
1000.0000 mg | ORAL_TABLET | Freq: Once | ORAL | Status: AC
  Administered 2023-02-05: 1000 mg via ORAL
  Filled 2023-02-05: qty 2

## 2023-02-05 MED ORDER — LACTATED RINGERS IV BOLUS: 1000.0000 mL | Freq: Once | INTRAVENOUS | Status: DC

## 2023-02-05 NOTE — Discharge Instructions (Signed)
[name] [name]  Thank you for allowing [place] to take care of you today.  You came to the Emergency Department today because you have recently been diagnosed with a tooth infection, you are on antibiotics and awaiting clearance from your cardiologist to have a procedure done to help fix this tooth.  You got worried about your heart because this morning you took your blood pressure after taking your blood pressure medications and your blood pressure was low, and there after you rechecked your heart rate several times and had progressive elevation of your heart rate.  On exam you are doing well, you are able to drink by mouth.  Your labs are looking well and your vitals have improved since you have been here.  We recommend increasing your intake of fluids, particularly nonsugar sweetened fluids such as water or sugar-free Gatorade.  You should continue to take your antibiotics, and follow-up with your dentist and your cardiologist.  Please check your blood pressure tomorrow before you take your blood pressure medications.Marland Kitchen   To-Do: 1. Please follow-up with your primary doctor within 2 days / as soon as possible.   Please return to the Emergency Department or call 911 if you experience have worsening of your symptoms, or do not get better, difficulty breathing, swelling, inability to tolerate liquids by mouth, chest pain, shortness of breath, severe or significantly worsening pain, high fever, severe confusion, pass out or have any reason to think that you need emergency medical care.   We hope you feel better soon.   Curley Spice, MD Department of Emergency Medicine [facility]

## 2023-02-05 NOTE — ED Triage Notes (Signed)
Pt came in via POV d/t noting her heart rate was high & her pressure was low since she has been on ABT for a lower-Rt abscess tooth that she has seen a dentist for. Pt had a heart attack back in January this yr & the dentist does not wait to do oral surgery this soon but she may need to anyway (per pt). Is still on her ABT that she started 3 days ago is on thinners. A/Ox4, rates dental pain 10/10, denies CP.

## 2023-02-05 NOTE — ED Provider Notes (Signed)
[place] EMERGENCY DEPARTMENT AT [facility] Provider Note  Medical Decision Making   HPI: [name] [name] [name] [name] is a 47 y.o. female with history perinent HTN, HLD, hypothyroidism, depression, OSA, CKD, CAD, ischemic cardiomyopathy and HFrEF who presents complaining of elevated heart rate. Patient arrived via POV accompanied by husband, [name] [name] Stable.  History provided by patient and husband at bedside.  Interpreter required for this encounter.  Reports that she has a history of dental caries, was recently diagnosed with a dental abscess on 4/24.  Reports that she has been on penicillin 4 times daily and has had improvement in her symptoms with that.  Reports that prior to today she has only been able to eat ice cream because of mouth pain, however now she is able to tolerate soda and noodles by mouth in addition to ice cream.  Denies any fevers, chills, chest pain, shortness of breath, nausea, vomiting, diarrhea.  Reports that her dentist told her that she will likely need surgical intervention on the tooth, however she takes Brilinta for her heart, therefore she must be first cleared by cardiology to pause the Brilinta prior to having the procedure done on tooth.  Reports that she has been adherent to her home medications in addition to her antibiotics.  She presents to the emergency department today because that this morning she took her antihypertensives then took her blood pressure and noted that it was lower than baseline at 90s over 70s.  She then became worried, and felt that her heart rate was elevated, she took her heart rate several times and it became progressively more elevated upwards of 1 teens to 120s.  She did not develop any other symptoms during this episode such as chest pain, shortness of breath, dizziness, syncope.  However she was concerned regarding her heart rate and blood pressure that something may be going on with her heart, therefore she came into the ED for  reevaluation.  ROS: As per HPI. Please see MAR for complete past medical history, surgical history, and social history.   Physical exam is pertinent for dental caries and gingival erythema at the right lower premolar.   The differential includes but is not limited to dental abscess, Ludwig's angina, SVT, anxiety, sepsis, dehydration.  Additional history obtained from: Husband at bedside External records from outside source obtained and reviewed including: None  ED provider interpretation of radiology/imaging: Not indicated  Labs ordered were interpreted by myself as well as my attending and were incorporated into the medical decision making process for this patient.  ED provider interpretation of labs: UA without UTI, lactic acid WNL, CMP without AKI or emergent electrolyte derangement, creatinine elevated at 1.46, baseline appears to be 1.4-1.5.  CBC without leukocytosis, anemia, thrombocytopenia.  Interventions: Tylenol  See the EMR for full details regarding lab and imaging results.  Exam, patient is overall well-appearing, has a stable airway, no evidence of AOM, mastoiditis, meningitis, Ludwig's angina on physical exam.  Patient's oropharyngeal exam is consistent with dental abscess.  With regard to tachycardia and hypotension, patient is normotensive here in the emergency department.  Heart rate was initially tachycardic in triage, however by the time of my evaluation, patient's heart rate had normalized to the high 90s.  When patient watches her heart rate on the screen it will occasionally creep up to the low 100s.  Overall, feel that patient's tachycardia at home that worsened with recheck is likely related to some component of anxiety with positive feedback loop with increasing tachycardia.  Labs are indicated, patient does not have any leukocytosis, no lactic acid elevation, doubt sepsis.  Possible some component of mild dehydration given patient has had decreased oral intake over  the past several days, however CMP without any emergent derangements.  On reevaluation patient has normalization of vitals, labs are reassuring, patient reports that she would rather have oral intake at home rather than IV fluids.  Recommended Tylenol and ibuprofen for pain control, continuation of home medications and antibiotics, and close follow-up with cardiology and dentistry.     Consults: Not indicated  Disposition: DISCHARGE: I believe that the patient is safe for discharge home with outpatient follow-up. Patient was informed of all pertinent physical exam, laboratory, and imaging findings. Patient's suspected etiology of their symptom presentation was discussed with the patient and all questions were answered. We discussed following up with PCP, cardiology, dentistry. I provided thorough ED return precautions. The patient feels safe and comfortable with this plan.  The plan for this patient was discussed with Dr. Anitra Lauth, who voiced agreement and who oversaw evaluation and treatment of this patient.  Clinical Impression:  1. Tachycardia    Discharge  Therapies: These medications and interventions were provided for the patient while in the ED. Medications  acetaminophen (TYLENOL) tablet 1,000 mg (1,000 mg Oral Given 02/05/23 1618)    MDM generated using voice dictation software and may contain dictation errors.  Please contact me for any clarification or with any questions.  Clinical Complexity A medically appropriate history, review of systems, and physical exam was performed.  Collateral history obtained from: Husband at bedside I personally reviewed the labs, EKG, imaging as discussed above. Patient's presentation is most consistent with acute complicated illness / injury requiring diagnostic workup Considered and ruled out life and body threatening conditions  Treatment: Outpatient follow-up Medications: OTC Discussed patient's care with providers from the following  different specialties: None  Physical Exam   ED Triage Vitals  Enc Vitals Group     BP 02/05/23 1350 (!) 138/100     Pulse Rate 02/05/23 1350 (!) 113     Resp 02/05/23 1350 18     Temp 02/05/23 1350 99.3 F (37.4 C)     Temp src --      SpO2 02/05/23 1350 99 %     Weight --      Height --      Head Circumference --      Peak Flow --      Pain Score 02/05/23 1404 10     Pain Loc --      Pain Edu? --      Excl. in GC? --      Physical Exam Vitals and nursing note reviewed.  Constitutional:      General: She is not in acute distress.    Appearance: She is well-developed. She is obese.  HENT:     Head: Normocephalic and atraumatic.     Right Ear: Tympanic membrane normal.     Left Ear: Tympanic membrane normal.     Ears:     Comments: No mastoid erythema or tenderness    Mouth/Throat:     Dentition: Gingival swelling and dental caries present.     Pharynx: Uvula midline.     Tonsils: No tonsillar exudate or tonsillar abscesses. 0 on the right. 0 on the left.      Comments: Dental carie and gingival edema at indicated tooth.  No fullness or tenderness to palpation of the floor of the mouth  Eyes:     Extraocular Movements: Extraocular movements intact.     Conjunctiva/sclera: Conjunctivae normal.     Pupils: Pupils are equal, round, and reactive to light.  Cardiovascular:     Rate and Rhythm: Normal rate and regular rhythm.     Pulses: Normal pulses.     Heart sounds: Normal heart sounds. No murmur heard. Pulmonary:     Effort: Pulmonary effort is normal. No respiratory distress.     Breath sounds: Normal breath sounds.  Abdominal:     Palpations: Abdomen is soft.     Tenderness: There is no abdominal tenderness.  Musculoskeletal:        General: No swelling.     Cervical back: Neck supple. No rigidity.  Lymphadenopathy:     Cervical: No cervical adenopathy.  Skin:    General: Skin is warm and dry.     Capillary Refill: Capillary refill takes less than 2  seconds.  Neurological:     Mental Status: She is alert and oriented to person, place, and time.  Psychiatric:        Mood and Affect: Mood normal.       Procedure Note  Procedures  No orders to display    Julianne Rice, MD Emergency Medicine, PGY-2   Curley Spice, MD 02/05/23 1653    Gwyneth Sprout, MD 02/06/23 1558

## 2023-02-05 NOTE — ED Notes (Signed)
Rt jaw swollen c/o extreme toothache

## 2023-02-05 NOTE — ED Notes (Signed)
No temp  the pt ust had oxycodoned and tylenol  one tablet around 1330

## 2023-04-30 ENCOUNTER — Encounter (HOSPITAL_COMMUNITY): Payer: Self-pay

## 2023-04-30 ENCOUNTER — Other Ambulatory Visit: Payer: Self-pay

## 2023-04-30 ENCOUNTER — Emergency Department (HOSPITAL_COMMUNITY)
Admission: EM | Admit: 2023-04-30 | Discharge: 2023-04-30 | Disposition: A | Discharge status: Home / Self Care | Payer: Medicare Other | Attending: Emergency Medicine | Admitting: Emergency Medicine

## 2023-04-30 ENCOUNTER — Emergency Department (HOSPITAL_COMMUNITY): Payer: Medicare Other

## 2023-04-30 ENCOUNTER — Emergency Department (HOSPITAL_BASED_OUTPATIENT_CLINIC_OR_DEPARTMENT_OTHER): Admit: 2023-04-30 | Discharge: 2023-04-30 | Disposition: A | Discharge status: Home / Self Care | Payer: Medicare Other

## 2023-04-30 DIAGNOSIS — I509 Heart failure, unspecified: Secondary | ICD-10-CM | POA: Diagnosis not present

## 2023-04-30 DIAGNOSIS — E876 Hypokalemia: Secondary | ICD-10-CM

## 2023-04-30 DIAGNOSIS — M79661 Pain in right lower leg: Secondary | ICD-10-CM

## 2023-04-30 DIAGNOSIS — Z7982 Long term (current) use of aspirin: Secondary | ICD-10-CM | POA: Insufficient documentation

## 2023-04-30 DIAGNOSIS — I2510 Atherosclerotic heart disease of native coronary artery without angina pectoris: Secondary | ICD-10-CM | POA: Insufficient documentation

## 2023-04-30 DIAGNOSIS — I110 Hypertensive heart disease with heart failure: Secondary | ICD-10-CM | POA: Insufficient documentation

## 2023-04-30 DIAGNOSIS — M79604 Pain in right leg: Secondary | ICD-10-CM | POA: Diagnosis present

## 2023-04-30 DIAGNOSIS — M79605 Pain in left leg: Secondary | ICD-10-CM | POA: Diagnosis not present

## 2023-04-30 DIAGNOSIS — Z79899 Other long term (current) drug therapy: Secondary | ICD-10-CM | POA: Insufficient documentation

## 2023-04-30 HISTORY — DX: Acute myocardial infarction, unspecified: I21.9

## 2023-04-30 LAB — BASIC METABOLIC PANEL
Anion gap: 12 (ref 5–15)
BUN: 8 mg/dL (ref 6–20)
CO2: 21 mmol/L — ABNORMAL LOW (ref 22–32)
Calcium: 8.5 mg/dL — ABNORMAL LOW (ref 8.9–10.3)
Chloride: 101 mmol/L (ref 98–111)
Creatinine, Ser: 1.5 mg/dL — ABNORMAL HIGH (ref 0.44–1.00)
GFR, Estimated: 42 mL/min — ABNORMAL LOW (ref >60)
Glucose, Bld: 135 mg/dL — ABNORMAL HIGH (ref 70–99)
Potassium: 2.9 mmol/L — ABNORMAL LOW (ref 3.5–5.1)
Sodium: 134 mmol/L — ABNORMAL LOW (ref 135–145)

## 2023-04-30 LAB — TSH: TSH: 3.447 u[IU]/mL (ref 0.350–4.500)

## 2023-04-30 LAB — CBC
HCT: 40 % (ref 36.0–46.0)
Hemoglobin: 13.7 g/dL (ref 12.0–15.0)
MCH: 31.5 pg (ref 26.0–34.0)
MCHC: 34.3 g/dL (ref 30.0–36.0)
MCV: 92 fL (ref 80.0–100.0)
Platelets: 233 10*3/uL (ref 150–400)
RBC: 4.35 MIL/uL (ref 3.87–5.11)
RDW: 14.1 % (ref 11.5–15.5)
WBC: 11.1 10*3/uL — ABNORMAL HIGH (ref 4.0–10.5)
nRBC: 0 % (ref 0.0–0.2)

## 2023-04-30 LAB — TROPONIN I (HIGH SENSITIVITY)
Troponin I (High Sensitivity): 26 ng/L — ABNORMAL HIGH (ref <18)
Troponin I (High Sensitivity): 34 ng/L — ABNORMAL HIGH (ref <18)

## 2023-04-30 MED ORDER — POTASSIUM CHLORIDE CRYS ER 20 MEQ PO TBCR
40.0000 meq | EXTENDED_RELEASE_TABLET | Freq: Once | ORAL | Status: AC
  Administered 2023-04-30: 40 meq via ORAL

## 2023-04-30 MED ORDER — POTASSIUM CHLORIDE CRYS ER 20 MEQ PO TBCR: 20.0000 meq | EXTENDED_RELEASE_TABLET | Freq: Two times a day (BID) | ORAL | 0 refills | Status: AC

## 2023-04-30 MED ORDER — POTASSIUM CHLORIDE 20 MEQ PO PACK: 40.0000 meq | PACK | Freq: Once | ORAL | Status: DC

## 2023-04-30 NOTE — ED Provider Notes (Signed)
[place] EMERGENCY DEPARTMENT AT [facility] Provider Note   CSN: [id number] Arrival date & time: 04/30/23  1322     History  No chief complaint on file.   [name] [name] is a 47 y.o. female.  The history is provided by the patient, medical records and the EMS personnel. No language interpreter was used.     47 year old female with extensive PMH including anxiety, HTN, CAD, CHF, presenting with concerns of leg pain.  Pt report she got back home from grocery when she notice pain to both calves.  She recall having similar pain like this in the past which was a resultant of an MI.  Therefore, she voice concerns.  She did not endorse any lightheadedness, dizziness, cp, sob, chest pressure, nausea, vomiting, focal numbness or focal weakness. Denies back pain.  Did report feeling quite anxious about the leg pain.  She took her blood pressure medication and nitroglycerin and called EMS.  Since then her leg pain has since resolved but pt still feel anxious.  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  She did not have chest pain at that time as well.  She denies hx of PE/DVT, no recent surgery, prolonged bedrest or taking hormone pills.  She is compliant with her medication.  Does admit to tobacco use but actively trying to quit. Report strong family hx of cardiac disease.   Home Medications Prior to Admission medications   Medication Sig Start Date End Date Taking? Authorizing Provider  aspirin 81 MG chewable tablet Chew 81 mg by mouth daily.    [provider]  atorvastatin (LIPITOR) 80 MG tablet Take 1 tablet (80 mg total) by mouth daily. 10/17/22   Marjie Skiff E, PA-C  bisacodyl (DULCOLAX) 5 MG EC tablet Take 5-10 mg by mouth daily as needed for mild constipation or moderate constipation.    [provider]  cyanocobalamin (VITAMIN B12) 500 MCG tablet Take 500 mcg by mouth  daily.    [provider]  cyclobenzaprine (FLEXERIL) 10 MG tablet Take 10 mg by mouth every 12 (twelve) hours as needed. 09/13/18   [provider]  folic acid (FOLVITE) 400 MCG tablet Take 400 mcg by mouth daily.    [provider]  furosemide (LASIX) 20 MG tablet Take 20 mg by mouth daily as needed. 10/06/22   [provider]  hydrocortisone 2.5 % cream Apply 1 Application topically 3 (three) times daily. 07/27/18   [provider]  isosorbide mononitrate (IMDUR) 30 MG 24 hr tablet Take 30 mg by mouth daily. 09/09/22   [provider]  levothyroxine (SYNTHROID, LEVOTHROID) 88 MCG tablet Take 88 mcg by mouth daily. 11/23/18   [provider]  lidocaine (XYLOCAINE) 2 % jelly Apply 1 Application topically daily as needed (pain). 11/20/20   [provider]  metoprolol succinate (TOPROL-XL) 25 MG 24 hr tablet Take 2 tablets (50mg ) in the morning and 1 tablet (25mg ) in the evening. 10/16/22   Corrin Parker, PA-C  nitroGLYCERIN (NITROSTAT) 0.4 MG SL tablet Place 0.4 mg under the tongue every 5 (five) minutes as needed for chest pain. 01/14/22   [provider]  omega-3 acid ethyl esters (LOVAZA) 1 g capsule Take 2 capsules (2 g total) by mouth 2 (two) times daily. 10/16/22   Corrin Parker, PA-C  oxyCODONE-acetaminophen (PERCOCET) 10-325 MG tablet Take 1 tablet  by mouth every 4 (four) hours as needed for pain.    [provider]  promethazine (PHENERGAN) 12.5 MG tablet Take 12.5 mg by mouth every 6 (six) hours as needed for vomiting or nausea. 03/14/18   [provider]  sacubitril-valsartan (ENTRESTO) 24-26 MG Take 1 tablet by mouth 2 (two) times daily.    [provider]  ticagrelor (BRILINTA) 90 MG TABS tablet Take 90 mg by mouth 2 (two) times daily. 02/10/22   [provider]  Vitamin D, Ergocalciferol, (DRISDOL) 1.25 MG (50000 UT) CAPS capsule Take 50,000 Units by mouth every 7 (seven)  days. 01/25/18   [provider]      Allergies    Gabapentin, Hydrochlorothiazide, Cortisone, and Pravastatin    Review of Systems   Review of Systems  All other systems reviewed and are negative.   Physical Exam Updated Vital Signs There were no vitals taken for this visit. Physical Exam Vitals and nursing note reviewed.  Constitutional:      General: She is not in acute distress.    Appearance: She is well-developed.  HENT:     Head: Atraumatic.  Eyes:     Conjunctiva/sclera: Conjunctivae normal.  Cardiovascular:     Rate and Rhythm: Normal rate and regular rhythm.  Pulmonary:     Effort: Pulmonary effort is normal.  Abdominal:     Palpations: Abdomen is soft.     Tenderness: There is no abdominal tenderness.  Musculoskeletal:     Cervical back: Neck supple.     Right lower leg: No edema.     Left lower leg: No edema.  Skin:    Capillary Refill: Capillary refill takes less than 2 seconds.     Findings: No rash.  Neurological:     Mental Status: She is alert.  Psychiatric:        Mood and Affect: Mood normal.     ED Results / Procedures / Treatments   Labs (all labs ordered are listed, but only abnormal results are displayed) Labs Reviewed - No data to display  EKG None ED ECG REPORT   Date: 04/30/2023  Rate: 108  Rhythm: sinus tachycardia  QRS Axis: right  Intervals: normal  ST/T Wave abnormalities: nonspecific ST/T changes  Conduction Disutrbances:right bundle branch block  Narrative Interpretation:   Old EKG Reviewed: unchanged  I have personally reviewed the EKG tracing and agree with the computerized printout as noted.    Radiology No results found.  Procedures Procedures    Medications Ordered in ED Medications - No data to display  ED Course/ Medical Decision Making/ A&P                             Medical Decision Making  BP 136/86   Temp 98.3 F (36.8 C) (Oral)   Resp 14   Ht 5' (1.524 m)   Wt 69.4 kg   SpO2  99%   BMI 29.88 kg/m   7:37 PM 47 year old female with extensive PMH including anxiety, HTN, CAD, CHF, presenting with concerns of leg pain.  Pt report she got back home from grocery when she notice pain to both calves.  She recall having similar pain like this in the past which was a resultant of an MI.  Therefore, she voice concerns.  She did not endorse any lightheadedness, dizziness, cp, sob, chest pressure, nausea, vomiting, focal numbness or focal weakness. Denies back pain.  Did report  feeling quite anxious about the leg pain.  She took her blood pressure medication and nitroglycerin and called EMS.  Since then her leg pain has since resolved but pt still feel anxious.  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  She did not have chest pain at that time as well.  She denies hx of PE/DVT, no recent surgery, prolonged bedrest or taking hormone pills.  She is compliant with her medication.  Does admit to tobacco use but actively trying to quit. Report strong family hx of cardiac disease.   On exam pt appears anxious but not in extremis.  Heart with tachycardia, lungs CTAB, abd NTND, no significant edema noted to BLE. Intact distal pulses throughout.  Not fluid overload.    -Labs ordered, and currently pending -The patient was maintained on a cardiac monitor.  I personally viewed and interpreted the cardiac monitored which showed an underlying rhythm of: sinus tachycardia -Imaging including CXR and venous doppler US are currently pending -This patient presents to the ED for concern of leg pain, this involves an extensive number of treatment options, and is a complaint that carries with it a high risk of complications and morbidity.  The differential diagnosis includes MSK pain, MI, DVT, electrolytes derangement -Co morbidities that complicate the patient evaluation includes cardiac disease -Treatment includes  monitoring -Reevaluation of the patient after these medicines showed that the patient improved -PCP office notes or outside notes reviewed -Discussion with oncoming provider who will continue with current work up -Escalation to admission/observation considered: dispo pending  Likely need delta trop, along with negative DVT study prior to discharge.          Final Clinical Impression(s) / ED Diagnoses Final diagnoses:  None    Rx / DC Orders ED Discharge Orders     None         Fayrene Helper, PA-C 04/30/23 1509    Pricilla Loveless, MD 05/03/23 [phone]

## 2023-04-30 NOTE — ED Provider Notes (Signed)
Accepted handoff at shift change from Fayrene Helper, PA-C. Please see prior provider note for more detail.   Briefly: Patient is 47 y.o. presents to the ED complaining of bilateral calf pain.  Patient concerned due to having similar pain which was resultant of an MI.  She did feel quite anxious about the leg pain.  She took BP medication and nitroglycerin and called EMS.  She denies chest pain, shortness of breath, chest tightness or pressure.  DDX: concern for ACS, DVT, PE  Plan: follow up on labs and imaging    Results for orders placed or performed during the hospital encounter of 04/30/23  Basic metabolic panel  Result Value Ref Range   Sodium 134 (L) 135 - 145 mmol/L   Potassium 2.9 (L) 3.5 - 5.1 mmol/L   Chloride 101 98 - 111 mmol/L   CO2 21 (L) 22 - 32 mmol/L   Glucose, Bld 135 (H) 70 - 99 mg/dL   BUN 8 6 - 20 mg/dL   Creatinine, Ser 8.29 (H) 0.44 - 1.00 mg/dL   Calcium 8.5 (L) 8.9 - 10.3 mg/dL   GFR, Estimated 42 (L) >60 mL/min   Anion gap 12 5 - 15  CBC  Result Value Ref Range   WBC 11.1 (H) 4.0 - 10.5 K/uL   RBC 4.35 3.87 - 5.11 MIL/uL   Hemoglobin 13.7 12.0 - 15.0 g/dL   HCT 56.2 13.0 - 86.5 %   MCV 92.0 80.0 - 100.0 fL   MCH 31.5 26.0 - 34.0 pg   MCHC 34.3 30.0 - 36.0 g/dL   RDW 78.4 69.6 - 29.5 %   Platelets 233 150 - 400 K/uL   nRBC 0.0 0.0 - 0.2 %  TSH  Result Value Ref Range   TSH 3.447 0.350 - 4.500 uIU/mL  Troponin I (High Sensitivity)  Result Value Ref Range   Troponin I (High Sensitivity) 26 (H) <18 ng/L  Troponin I (High Sensitivity)  Result Value Ref Range   Troponin I (High Sensitivity) 34 (H) <18 ng/L   VAS [place] LOWER EXTREMITY VENOUS (DVT) (7a-7p)  Result Date: 04/30/2023  Lower Venous DVT Study Patient Name:  [name] [name]  Date of Exam:   04/30/2023 Medical Rec #: [id number]        Accession #:    [id number] Date of Birth: Aug 12, 1976        Patient Gender: F Patient Age:   53 years Exam Location:  [facility] Procedure:      VAS [place] LOWER  EXTREMITY VENOUS (DVT) Referring Phys: Fayrene Helper --------------------------------------------------------------------------------  Indications: Pain.  Risk Factors: None identified. Comparison Study: No prior study Performing Technologist: Shona Simpson  Examination Guidelines: A complete evaluation includes B-mode imaging, spectral Doppler, color Doppler, and power Doppler as needed of all accessible portions of each vessel. Bilateral testing is considered an integral part of a complete examination. Limited examinations for reoccurring indications may be performed as noted. The reflux portion of the exam is performed with the patient in reverse Trendelenburg.  +---------+---------------+---------+-----------+----------+--------------+ RIGHT    CompressibilityPhasicitySpontaneityPropertiesThrombus Aging +---------+---------------+---------+-----------+----------+--------------+ CFV      Full           Yes      Yes                                 +---------+---------------+---------+-----------+----------+--------------+ SFJ      Full                                                        +---------+---------------+---------+-----------+----------+--------------+  FV Prox  Full                                                        +---------+---------------+---------+-----------+----------+--------------+ FV Mid   Full                                                        +---------+---------------+---------+-----------+----------+--------------+ FV DistalFull                                                        +---------+---------------+---------+-----------+----------+--------------+ PFV      Full                                                        +---------+---------------+---------+-----------+----------+--------------+ POP      Full           Yes      Yes                                  +---------+---------------+---------+-----------+----------+--------------+ PTV      Full                                                        +---------+---------------+---------+-----------+----------+--------------+ PERO     Full                                                        +---------+---------------+---------+-----------+----------+--------------+   +---------+---------------+---------+-----------+----------+--------------+ LEFT     CompressibilityPhasicitySpontaneityPropertiesThrombus Aging +---------+---------------+---------+-----------+----------+--------------+ CFV      Full           Yes      Yes                                 +---------+---------------+---------+-----------+----------+--------------+ SFJ      Full                                                        +---------+---------------+---------+-----------+----------+--------------+ FV Prox  Full                                                        +---------+---------------+---------+-----------+----------+--------------+  FV Mid   Full                                                        +---------+---------------+---------+-----------+----------+--------------+ FV DistalFull                                                        +---------+---------------+---------+-----------+----------+--------------+ PFV      Full                                                        +---------+---------------+---------+-----------+----------+--------------+ POP      Full           Yes      Yes                                 +---------+---------------+---------+-----------+----------+--------------+ PTV      Full                                                        +---------+---------------+---------+-----------+----------+--------------+ PERO     Full                                                         +---------+---------------+---------+-----------+----------+--------------+     Summary: BILATERAL: - No evidence of deep vein thrombosis seen in the lower extremities, bilaterally. -No evidence of popliteal cyst, bilaterally.   *See table(s) above for measurements and observations. Electronically signed by Sherald Hess MD on 04/30/2023 at 5:01:51 PM.    Final    DG Chest Port 1 View  Result Date: 04/30/2023 CLINICAL DATA:  leg pain EXAM: PORTABLE CHEST 1 VIEW COMPARISON:  11/05/2022. FINDINGS: Bilateral lung fields are clear. Bilateral costophrenic angles are clear. Normal cardio-mediastinal silhouette. No acute osseous abnormalities. The soft tissues are within normal limits. IMPRESSION: No active disease. Electronically Signed   By: Jules Schick M.D.   On: 04/30/2023 14:14    Physical Exam  BP 111/72   Temp 98.4 F (36.9 C)   Resp (!) 27   Ht 5' (1.524 m)   Wt 69.4 kg   SpO2 99%   BMI 29.88 kg/m   Physical Exam Vitals and nursing note reviewed.  Constitutional:      General: She is not in acute distress.    Appearance: Normal appearance. She is not ill-appearing or diaphoretic.  Cardiovascular:     Rate and Rhythm: Normal rate and regular rhythm.     Pulses: Normal pulses.  Pulmonary:     Effort: Pulmonary effort is normal.  Skin:    General: Skin is warm and dry.  Capillary Refill: Capillary refill takes less than 2 seconds.  Neurological:     Mental Status: She is alert. Mental status is at baseline.  Psychiatric:        Mood and Affect: Mood normal.        Behavior: Behavior normal.     Procedures  Procedures  ED Course / MDM    Medical Decision Making Amount and/or Complexity of Data Reviewed Labs: ordered. Radiology: ordered.  Risk Prescription drug management.   Patient's troponin near her baseline with a delta of 8.  DVT study was negative.  Chest x-ray without acute findings.  Metabolic panel with hypokalemia, patient given PO potassium.  Upon  reassessment, patient reports she is feeling well and is not having any pain.  Discussed results and findings with patient.  Will send patient home on potassium supplement.  She reports increased use of Lasix, which is likely the cause.  Patient already has scheduled cardiology appointment for follow up.  Advised patient to have her labs rechecked at that visit.    The patient has been appropriately medically screened and/or stabilized in the ED. I have low suspicion for any other emergent medical condition which would require further screening, evaluation or treatment in the ED or require inpatient management. At time of discharge the patient is hemodynamically stable and in no acute distress. I have discussed work-up results and diagnosis with patient and answered all questions. Patient is agreeable with discharge plan. We discussed strict return precautions for returning to the emergency department and they verbalized understanding.          Melton Alar R, PA-C 05/01/23 1559    Glyn Ade, MD 05/03/23 1511

## 2023-04-30 NOTE — ED Triage Notes (Signed)
Per EMS and pt report, pt is reporting bilateral calf pain. 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 Pt was given ASA and nitroglycerin enroute and it brought her pain from 2/10 to 0/10.

## 2023-04-30 NOTE — Discharge Instructions (Addendum)
Thank you for allowing [place] to be a part of your care.  You were evaluated in the ED for bilateral leg pain.   Your workup today is overall reassuring.  Your ultrasound did not show any evidence of a blood clot in either of your legs.  Your troponin and ECG were also not indicative of a heart attack.  Your potassium was low, I have sent over prescription for potassium supplementation.  This is likely secondary to your furosemide (Lasix) use.   Please see your cardiologist as scheduled and have them recheck your blood work.    Return to the ED if you develop sudden worsening of your symptoms or if you have any new concerns.
